# Patient Record
Sex: Male | Born: 1955 | Race: White | Hispanic: No | Smoking: Current every day smoker
Health system: Southern US, Community
[De-identification: ages and names within clinical notes are randomized; demographics above are authoritative.]

## PROBLEM LIST (undated history)

## (undated) DIAGNOSIS — I10 Essential (primary) hypertension: Secondary | ICD-10-CM

## (undated) DIAGNOSIS — J449 Chronic obstructive pulmonary disease, unspecified: Secondary | ICD-10-CM

## (undated) DIAGNOSIS — G4730 Sleep apnea, unspecified: Secondary | ICD-10-CM

## (undated) HISTORY — PX: SHOULDER SURGERY: SHX246

---

## 2004-01-10 ENCOUNTER — Emergency Department (HOSPITAL_COMMUNITY): Admission: EM | Admit: 2004-01-10 | Discharge: 2004-01-10 | Payer: Self-pay | Admitting: *Deleted

## 2004-01-20 ENCOUNTER — Emergency Department (HOSPITAL_COMMUNITY): Admission: EM | Admit: 2004-01-20 | Discharge: 2004-01-21 | Payer: Self-pay | Admitting: Emergency Medicine

## 2004-02-14 ENCOUNTER — Emergency Department (HOSPITAL_COMMUNITY): Admission: EM | Admit: 2004-02-14 | Discharge: 2004-02-14 | Payer: Self-pay | Admitting: Emergency Medicine

## 2004-03-01 ENCOUNTER — Ambulatory Visit: Payer: Self-pay | Admitting: Orthopedic Surgery

## 2004-03-14 ENCOUNTER — Ambulatory Visit: Payer: Self-pay | Admitting: Orthopedic Surgery

## 2004-05-28 ENCOUNTER — Ambulatory Visit: Payer: Self-pay | Admitting: Orthopedic Surgery

## 2004-06-20 ENCOUNTER — Ambulatory Visit (HOSPITAL_COMMUNITY): Admission: RE | Admit: 2004-06-20 | Discharge: 2004-06-20 | Payer: Self-pay | Admitting: Orthopedic Surgery

## 2004-07-02 ENCOUNTER — Ambulatory Visit: Payer: Self-pay | Admitting: Orthopedic Surgery

## 2004-07-17 ENCOUNTER — Ambulatory Visit: Payer: Self-pay | Admitting: Orthopedic Surgery

## 2004-07-17 ENCOUNTER — Ambulatory Visit (HOSPITAL_COMMUNITY): Admission: RE | Admit: 2004-07-17 | Discharge: 2004-07-17 | Payer: Self-pay | Admitting: Orthopedic Surgery

## 2004-07-19 ENCOUNTER — Ambulatory Visit: Payer: Self-pay | Admitting: Orthopedic Surgery

## 2004-08-08 ENCOUNTER — Ambulatory Visit: Payer: Self-pay | Admitting: Orthopedic Surgery

## 2004-10-08 ENCOUNTER — Ambulatory Visit: Payer: Self-pay | Admitting: Orthopedic Surgery
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
## 2006-05-16 ENCOUNTER — Emergency Department (HOSPITAL_COMMUNITY): Admission: EM | Admit: 2006-05-16 | Discharge: 2006-05-16 | Payer: Self-pay | Admitting: Emergency Medicine

## 2006-08-09 ENCOUNTER — Inpatient Hospital Stay (HOSPITAL_COMMUNITY): Admission: EM | Admit: 2006-08-09 | Discharge: 2006-08-12 | Payer: Self-pay | Admitting: Emergency Medicine

## 2007-06-04 ENCOUNTER — Emergency Department (HOSPITAL_COMMUNITY): Admission: EM | Admit: 2007-06-04 | Discharge: 2007-06-04 | Payer: Self-pay | Admitting: Emergency Medicine

## 2007-10-07 ENCOUNTER — Ambulatory Visit (HOSPITAL_COMMUNITY): Admission: RE | Admit: 2007-10-07 | Discharge: 2007-10-07 | Payer: Self-pay | Admitting: Family Medicine

## 2010-09-07 NOTE — H&P (Signed)
[name], [name] NO.:  [id number]   MEDICAL RECORD NO.:  [id number]          PATIENT TYPE:  INP   LOCATION:  A206                          FACILITY:  [facility]   PHYSICIAN:  Skeet Latch, DO    DATE OF BIRTH:  12-Dec-1955   DATE OF ADMISSION:  08/08/2006  DATE OF DISCHARGE:  LH                              HISTORY & PHYSICAL   CHIEF COMPLAINT:  1. Abdominal pain.  2. Chest pain.   HISTORY OF PRESENT ILLNESS:  This is a 55 year old Caucasian male who  presents complaining of some abdominal and chest discomfort times a few  days.  The patient states he has been having some left shoulder, hand,  arm pain with some radiation to the left side of his chest for the last  approximately 3 days.  The patient also complaining of some numbness of  his right hand that he has been taking Tylenol for.  The patient  describes the chest pain as episodic, located in his chest, it radiates  sometimes to his bilateral arms and shoulders.  This worse is 10/10.  Patient denied any history of any cardiac disease or previous chest pain  episodes, states that he routinely has chronic back pain which he takes  medication for.  The patient states because of these symptoms he has had  to come to the emergency room.  Upon coming to the emergency room he was  found to have a BUN of 52 and a creatinine of 5.47 and a sodium of 132.  When asked about water intake, patient states that he drinks very little  water and has been out in the sun recently and has gotten sunburn.  Patient also states that [facility] the past and has been off his pain medication for  awhile.  Upon my exam, patient is chest pain free and in no acute  distress.   PAST MEDICAL HISTORY:  1. Hypertension.  2. Chronic back pain.   SURGICAL HISTORY:  Rotator cuff repair left side.   SOCIAL HISTORY:  1. A 1-1/2 pack per day smoker for over 30 years.  2.  Denies any alcohol or illicit drug use.   ALLERGIES:  NO KNOWN DRUG ALLERGIES.   MEDICATIONS:  1. Lisinopril/HCTZ 20/12.5 once a day.  2. Flexeril as needed.  3. Aspirin 81 mg daily.   REVIEW OF SYSTEMS:  CONSTITUTIONAL:  The patient does admit to some  weight gain, unknown amount.  Denies any fever, chills.  HEENT:  Denies  any eye pain, blurry vision, throat pain or neck pain.  CARDIOVASCULAR:  Some slight chest pain.  RESPIRATORY:  Denies any cough, dyspnea,  wheezing.  GASTROINTESTINAL:  Denies any abdominal pain.  Does have some  abdominal distention.  No diarrhea.  GENITOURINARY:  Denies dysuria or  urgency, flank pain.  MUSCULOSKELETAL:  Positive for chronic back pain  and does have some left shoulder and arm pain.  All other systems are  negative.   PHYSICAL EXAM:  VITAL SIGNS:  Temperature 97.5, pulse 106,  respirations  20, blood pressure 98/52.  HEENT:  Head is atraumatic, normocephalic.  PERRLA, EOMI.  NECK:  Nontender, nondistended.  No thyromegaly, no JVD.  CARDIOVASCULAR:  Tachycardia, regular rhythm.  No murmurs, gallops or  rubs.  RESPIRATORY:  Lungs are clear to auscultation bilaterally.  No rales,  rhonchi or wheezing.  ABDOMEN:  Soft, distended, nontender, decreased bowel sounds occurred.  EXTREMITIES:  No clubbing, cyanosis, erythema.  NEUROLOGICALLY:  Cranial nerves II-XII are grossly intact.  The patient  is alert and oriented x3.   LABS:  ABG:  A pH 7.324, pCO2 is 42.8, pO2 104, bicarb 21.6.  Sodium  132, potassium 4.8, chloride 94, CO2 25, glucose 110.  BUN 52,  creatinine 5.47, AST is 36, ALT 61, amylase 118, lipase 26, troponin  less than 0.05 x2, myoglobin over 500, BNP less than 30, alkaline  phosphatase 62.  Chest x-ray showed no acute findings.   ASSESSMENT AND PLAN:  1. Acute renal failure/insufficiency.  Patient will be IV hydrated      aggressively.  Patient will receive another bolus then have a      maintenance IV at 125 mL per hour.  Will  get a repeat BUN and      creatinine in the a.m. as well as a renal consult.  2. Chest pain.  Unsure if this is cardiac in nature versus      musculoskeletal.  Patient's troponins have been negative times two,      I will repeat EKG in the a.m., patient will be sent to Telemetry      Unit for monitoring.  3. Hyponatremia.  Patient will be IV hydrated with normal saline.  Any      recommendations will be performed based on repeat sodium that is      repeated in the morning.  4. Patient was found to be a little hypotensive.  Hopefully this was      corrected with his IV fluids and will get blood cultures times two      as well as urine culture and sensitivity.  Will hold his blood      pressure medications if blood pressure continues to be low.  The      patient will be followed carefully.      Skeet Latch, DO  Electronically Signed     SM/MEDQ  D:  08/09/2006  T:  08/09/2006  Job:  [id number]

## 2010-09-07 NOTE — Group Therapy Note (Signed)
Christian Aguilar, Christian Aguilar NO.:  [id number]   MEDICAL RECORD NO.:  [id number]          PATIENT TYPE:  INP   LOCATION:  A206                          FACILITY:  [facility]   PHYSICIAN:  Margaretmary Dys, M.D.DATE OF BIRTH:  [birth year]   DATE OF PROCEDURE:  08/09/2006  DATE OF DISCHARGE:                                 PROGRESS NOTE   SUBJECTIVE:  The patient feels a little bit better, continues to have a  backache which is fairly chronic. The patient was admitted with  generalized pain and also chest pain.  Frankly, the patient says over  the past three days he has been working with someone in the yard and  also carrying lumber. The patient has some sunburn too.  He has not had  much water to drink.  On admission he was hypotensive and was in acute  renal failure.   OBJECTIVE:  GENERAL:  Conscious, alert and comfortable. No in acute  distress although the patient was complaining about his Foley catheter.  VITAL SIGNS:  His blood pressure was improved to 97/58, pulse of 91,  respiration was 20, temperature was 96.5, pulse of 93, O2 saturation was  94% on 2 liters.  HEENT EXAM:  Normocephalic, atraumatic. Oral mucosa was dry, no exudates  were noted.  NECK:  Supple, no JVD or lymphadenopathy.  LUNGS:  Clear bilaterally with good air entry.  HEART:  S1, S2 regular, no S3, gallops or rubs.  ABDOMEN:  Soft, nontender, bowel sounds positive, no mass is palpable.  EXTREMITIES:  No edema.   DIAGNOSTIC DATA:  White blood cell count was 13,000, hemoglobin of 13,  hematocrit 35.2, platelet count was 291,000. Sodium 133, potassium 5,  chloride of 99, CO2 22, glucose of 98, BUN of 51, creatinine was 5.52.  AST is 36, ALT of 61.  His Myoglobin was greater than 500. TSH was 1.8.  Urine drug screen was positive for benzodiazepines and opiates. These  are the medications the patient has been on.   ASSESSMENT AND PLAN:  A 55 year old Caucasian male admitted yesterday  with generalized  muscle aches and also sunburn. The patient said he has  been doing some strenuous activity over the past 3 days.  On evaluation  and actual review, I think that he may have rhabdomyolysis.  This causes  Myoglobin to be elevated and also caused his acute renal failure.   I will increase his IV fluids at this time to saline at 200 cc/Hr. I  agree with treatment with Kayexalate by the nephrologist.  Will repeat  BUN and creatinine in the morning and also CK and Myoglobin levels.   His blood pressure is slightly improved. Would continue checking I&O's  at this time.  I do not see any evidence of a drug induced or viral  induced rhabdomyolysis.   I appreciate Dr. Susa Griffins input.      Margaretmary Dys, M.D.  Electronically Signed     AM/MEDQ  D:  08/09/2006  T:  08/09/2006  Job:  [id number]   cc:   Jorja Loa, M.D.  Fax: [phone]

## 2010-09-07 NOTE — Discharge Summary (Signed)
NAMEJAHKEEM, KURKA NO.:  [id number]   MEDICAL RECORD NO.:  [id number]          PATIENT TYPE:  INP   LOCATION:  A206                          FACILITY:  [facility]   PHYSICIAN:  Skeet Latch, DO    DATE OF BIRTH:  May 23, 1955   DATE OF ADMISSION:  08/08/2006  DATE OF DISCHARGE:  04/22/2008LH                               DISCHARGE SUMMARY   DISCHARGE DIAGNOSES:  1. Acute renal failure.  2. Hypotension.  3. Hyponatremia.  4. Leukocytosis.  5. Atypical chest pain.   BRIEF HOSPITAL COURSE:  This is a 55 year old Caucasian male who  presented complaining of some mild abdominal pain and chest discomfort  for a few days.  The patient stated he had some left shoulder, hand and  arm pain with radiation to his left chest for approximately 3 days.  At  that time patient did describe his chest pain as episodic, located in  his chest, that radiated to bilateral arms and shoulders.  It was 10/10  at its worse.  The patient had no history of cardiac disease or any  previous episodes of chest pain.  The patient does have a history of  chronic back pain for which he takes medication.  Upon presenting to the  emergency room, the patient was found to have a BUN of 52, creatinine of  5.47 and sodium of 132.  The patient was admitted and IV hydrated  aggressively.  Also, a nephrology consult was obtained.  The patient was  subsequently placed on Lasix and diuresed, and his BUN and creatinine  returned to within normal limits.  The patient had leukocytosis which  also improved.  The patient was hyponatremic; with IV hydration this  also returned to normal range.  The patient's CK and myoglobin levels  were also elevated and, with IV hydration, they also have returned to  normal range.  The patient's Lasix was discontinued and his IV fluids  were decreased.  The patient's symptoms have improved.  The patient has  been chest pain-free during his total hospital stay.  The patient did  have a chest x-ray performed which was negative.  Also had an abdominal  exam and x-ray which was also negative.  Had renal ultrasound performed,  which also was within normal limits.   VITALS ON DISCHARGE:  Blood pressure is 135/95, respirations are 20,  temperature is 97.3 and heart rate is 108.   DISCHARGE MEDICATIONS:  Include aspirin 81 mg daily, Lorcet 10/650 mg  p.o. q. 6h p.r.n. lisinopril/HCTZ 20/12.5 mg p.o. daily, and Flexeril 10  mg t.i.d. p.r.n.   DISCHARGE INSTRUCTIONS:  The patient is to follow-up with his primary  care physician in 7-10 days.  The patient will start on a heart-healthy  diet.  The patient return to work in 1-2 weeks.   ACTIVITY:  Increase activity slowly.   CONDITION ON DISCHARGE:  The patient be discharged to home in stable  condition.      Skeet Latch, DO  Electronically Signed     SM/MEDQ  D:  08/12/2006  T:  08/12/2006  Job:  [id number] 

## 2010-09-07 NOTE — Op Note (Signed)
NAMENOA, CONSTANTE NO.:  [id number]   MEDICAL RECORD NO.:  [id number]          PATIENT TYPE:  AMB   LOCATION:  DAY                           FACILITY:  [facility]   PHYSICIAN:  Vickki Hearing, M.D.DATE OF BIRTH:  [birth year]   DATE OF PROCEDURE:  07/17/2004  DATE OF DISCHARGE:                                 OPERATIVE REPORT   PREOPERATIVE DIAGNOSIS:  Rotator cuff tear, left shoulder; osteoarthritis  acromioclavicular joint, left shoulder.   POSTOPERATIVE DIAGNOSIS:  Osteoarthritis and rotator cuff syndrome, left  shoulder.   PROCEDURE:  Open acromioplasty and Mumford, left shoulder.   SURGEON:  Dr. Romeo Apple.   ANESTHETIC:  General.   OPERATIVE FINDINGS:  Rotator cuff was intact. There was a thickened  subacromial bursa, and there was impinging osteophyte of the  acromioclavicular joint.   INDICATIONS:  Pain. MRI indicating high-grade partial tearing of the rotator  cuff.   DETAILS OF PROCEDURE:  Mr. Broad was identified in preoperative holding  area. His left shoulder was marked for surgery, countersigned by the  surgeon. He was given preoperative antibiotics. His chart was reviewed, his  history and physical was updated, and he was taken to surgery. He had  general anesthesia. He was placed supine with a bump under left shoulder  with a modified beach-chair position. He had his left shoulder prepped and  draped using sterile technique. A time-out was taken. The procedure was  confirmed as a left shoulder open rotator cuff repair and open Mumford  procedure.   An incision was made over the acromioclavicular joint and extended in  between the mid and anterior deltoid. Subcutaneous tissue was divided. The  raphe between the middle and anterior deltoid was split as one continuous  flap and carried across the [facility] joint. Subperiosteal dissection exposed the  [facility] joint, and the distal clavicle was removed. An acromioplasty was  performed. A bursectomy was  done, and the rotator cuff was inspected, found  to be intact. The wounds were irrigated. The soft tissues were injected with  Marcaine. The deltoid fascial trapezial split was closed with interrupted  suture followed by closure of the subcu with 2-0 Monocryl. Skin staples were  used to  close the skin. A pain pump catheter was inserted in the subcu tissue.  Sterile dressings were applied. The patient was extubated, placed in a  CryoCuff and taken recovery room in stable condition. He can start physical  therapy in three days, and he can be discharged home today. Follow-up in 2  days      SEH/MEDQ  D:  07/17/2004  T:  07/17/2004  Job:  [id number]
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
## 2010-09-07 NOTE — Group Therapy Note (Signed)
Christian Aguilar, Christian Aguilar               ACCOUNT NO.:  [id number]   MEDICAL RECORD NO.:  [id number]          PATIENT TYPE:  INP   LOCATION:  A206                          FACILITY:  [facility]   PHYSICIAN:  Margaretmary Dys, M.D.DATE OF BIRTH:  08-Sep-1955   DATE OF PROCEDURE:  DATE OF DISCHARGE:                                 PROGRESS NOTE   SUBJECTIVE:  The patient reports some improvement in his pain. He was  admitted yesterday with acute renal failure and generalized myalgia.  There was also a concern for possible rhabdomyolysis. He denies any  chest pain. His blood pressure is improved, as he was also hypotensive.   OBJECTIVE:  GENERAL:  Conscious, laying comfortable, no acute distress.  VITAL SIGNS:  Blood pressure 127/79, pulse 100, respiration 22,  temperature max 98.6, oxygen saturation 94% on 2 1/2 liters.  HEENT:  Normocephalic atraumatic. Oral mucosa was moist. No exudate.  NECK:  Supple with no JVD or lymphadenopathy.  LUNGS:  Clear clinically with good air entry bilaterally.  HEART:  S1, S2. Regular rate and rhythm with no murmurs, gallops, or  rubs.  ABDOMEN:  Obese, soft. Bowel sounds were positive.  EXTREMITIES:  Bilaterally without any pedal edema.  CNS: Grossly intact.   LABORATORY DATA:  Kidney ultrasound was normal with no evidence of  hydronephrosis.   WBC was 14.2, hemoglobin 11.4, hematocrit 72.2, platelet count 73.   Sodium 129, potassium 4.9, chloride 101, CO2 19, glucose 111, BUN 57,  creatinine 5.99 which is slightly worse compared to yesterday. Calcium  8.2, total CK was 529. Urinalysis shows positive blood, trace protein;  microscopy shows 11-20 wbc's with few bacteria. Input and output shows  reduced output for the last 24 hours despite a significant amount of  fluid.   ASSESSMENT AND PLAN:  Mr. Christian Aguilar is a 55 year old Caucasian male admitted  with acute renal failure.   It appears that the patient was also hypotensive when he was admitted,  possibly  from dehydration. His myoglobin level was also noted to be  elevated. His acute renal failure is secondary to rhabdomyolysis, acute  tubular necrosis. His urine output has dropped off in the past 24 hours.  The patient has also been seen by nephrology, Dr. Kristian Covey.   I agree with the plan to diurese him with Lasix. I will continue to  monitor him. We will continue IV fluids at current rate. The patient  does not have hyperkalemia or indications for emergency dialysis at this  time. He remains hemodynamically stable.      Margaretmary Dys, M.D.  Electronically Signed     AM/MEDQ  D:  08/11/2006  T:  08/11/2006  Job:  [id number]
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
## 2011-02-21 DIAGNOSIS — Y92410 Unspecified street and highway as the place of occurrence of the external cause: Secondary | ICD-10-CM | POA: Insufficient documentation

## 2011-02-21 DIAGNOSIS — J449 Chronic obstructive pulmonary disease, unspecified: Secondary | ICD-10-CM | POA: Insufficient documentation

## 2011-02-21 DIAGNOSIS — J4489 Other specified chronic obstructive pulmonary disease: Secondary | ICD-10-CM | POA: Insufficient documentation

## 2011-02-21 DIAGNOSIS — I10 Essential (primary) hypertension: Secondary | ICD-10-CM | POA: Insufficient documentation

## 2011-02-21 DIAGNOSIS — R079 Chest pain, unspecified: Secondary | ICD-10-CM | POA: Insufficient documentation

## 2011-02-21 DIAGNOSIS — G4730 Sleep apnea, unspecified: Secondary | ICD-10-CM | POA: Insufficient documentation

## 2011-02-21 DIAGNOSIS — E119 Type 2 diabetes mellitus without complications: Secondary | ICD-10-CM | POA: Insufficient documentation

## 2011-02-21 DIAGNOSIS — Z79899 Other long term (current) drug therapy: Secondary | ICD-10-CM | POA: Insufficient documentation

## 2011-02-22 ENCOUNTER — Encounter: Payer: Self-pay | Admitting: *Deleted

## 2011-02-22 ENCOUNTER — Emergency Department (HOSPITAL_COMMUNITY): Payer: No Typology Code available for payment source

## 2011-02-22 ENCOUNTER — Emergency Department (HOSPITAL_COMMUNITY)
Admission: EM | Admit: 2011-02-22 | Discharge: 2011-02-22 | Disposition: A | Discharge status: Home / Self Care | Payer: No Typology Code available for payment source | Attending: Emergency Medicine | Admitting: Emergency Medicine

## 2011-02-22 DIAGNOSIS — T148XXA Other injury of unspecified body region, initial encounter: Secondary | ICD-10-CM

## 2011-02-22 HISTORY — DX: Essential (primary) hypertension: I10

## 2011-02-22 HISTORY — DX: Sleep apnea, unspecified: G47.30

## 2011-02-22 HISTORY — DX: Chronic obstructive pulmonary disease, unspecified: J44.9

## 2011-02-22 MED ORDER — HYDROCODONE-ACETAMINOPHEN 5-500 MG PO TABS: 1.0000 | ORAL_TABLET | Freq: Four times a day (QID) | ORAL | Status: AC | PRN

## 2011-02-22 MED ORDER — CYCLOBENZAPRINE HCL 10 MG PO TABS: 10.0000 mg | ORAL_TABLET | Freq: Two times a day (BID) | ORAL | Status: AC | PRN

## 2011-02-22 MED ORDER — IBUPROFEN 400 MG PO TABS
400.0000 mg | ORAL_TABLET | Freq: Once | ORAL | Status: AC
  Administered 2011-02-22: 400 mg via ORAL
  Filled 2011-02-22: qty 1

## 2011-02-22 NOTE — ED Notes (Signed)
Pt self ambulated out with a steady gait stating no needs 

## 2011-02-22 NOTE — ED Notes (Signed)
Pt self ambulating with a steady gait no hesitation of motion in bending at the waist, full ROM of neck without hesitation or grimace. Pt self repositioning in bed and in room no noted distress.
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
## 2011-03-05 ENCOUNTER — Encounter (HOSPITAL_COMMUNITY): Payer: Self-pay

## 2011-03-05 ENCOUNTER — Emergency Department (HOSPITAL_COMMUNITY)
Admission: EM | Admit: 2011-03-05 | Discharge: 2011-03-05 | Disposition: A | Discharge status: Home / Self Care | Payer: No Typology Code available for payment source | Attending: Emergency Medicine | Admitting: Emergency Medicine

## 2011-03-05 ENCOUNTER — Emergency Department (HOSPITAL_COMMUNITY): Payer: No Typology Code available for payment source

## 2011-03-05 DIAGNOSIS — J449 Chronic obstructive pulmonary disease, unspecified: Secondary | ICD-10-CM | POA: Insufficient documentation

## 2011-03-05 DIAGNOSIS — G4730 Sleep apnea, unspecified: Secondary | ICD-10-CM | POA: Insufficient documentation

## 2011-03-05 DIAGNOSIS — S139XXA Sprain of joints and ligaments of unspecified parts of neck, initial encounter: Secondary | ICD-10-CM | POA: Insufficient documentation

## 2011-03-05 DIAGNOSIS — S39012A Strain of muscle, fascia and tendon of lower back, initial encounter: Secondary | ICD-10-CM

## 2011-03-05 DIAGNOSIS — Y92410 Unspecified street and highway as the place of occurrence of the external cause: Secondary | ICD-10-CM | POA: Insufficient documentation

## 2011-03-05 DIAGNOSIS — S335XXA Sprain of ligaments of lumbar spine, initial encounter: Secondary | ICD-10-CM | POA: Insufficient documentation

## 2011-03-05 DIAGNOSIS — S161XXA Strain of muscle, fascia and tendon at neck level, initial encounter: Secondary | ICD-10-CM

## 2011-03-05 DIAGNOSIS — I10 Essential (primary) hypertension: Secondary | ICD-10-CM | POA: Insufficient documentation

## 2011-03-05 DIAGNOSIS — E119 Type 2 diabetes mellitus without complications: Secondary | ICD-10-CM | POA: Insufficient documentation

## 2011-03-05 DIAGNOSIS — J4489 Other specified chronic obstructive pulmonary disease: Secondary | ICD-10-CM | POA: Insufficient documentation

## 2011-03-05 MED ORDER — HYDROCODONE-ACETAMINOPHEN 5-325 MG PO TABS: 1.0000 | ORAL_TABLET | ORAL | Status: AC | PRN

## 2011-03-05 MED ORDER — HYDROCODONE-ACETAMINOPHEN 5-325 MG PO TABS: 1.0000 | ORAL_TABLET | Freq: Once | ORAL | Status: DC

## 2011-03-05 MED ORDER — CYCLOBENZAPRINE HCL 10 MG PO TABS: 10.0000 mg | ORAL_TABLET | Freq: Once | ORAL | Status: DC

## 2011-03-05 MED ORDER — HYDROCODONE-ACETAMINOPHEN 5-325 MG PO TABS
1.0000 | ORAL_TABLET | Freq: Once | ORAL | Status: AC
  Administered 2011-03-05: 1 via ORAL
  Filled 2011-03-05: qty 1

## 2011-03-05 MED ORDER — CYCLOBENZAPRINE HCL 10 MG PO TABS: 10.0000 mg | ORAL_TABLET | Freq: Three times a day (TID) | ORAL | Status: AC | PRN

## 2011-03-05 MED ORDER — CYCLOBENZAPRINE HCL 10 MG PO TABS
10.0000 mg | ORAL_TABLET | Freq: Once | ORAL | Status: AC
  Administered 2011-03-05: 10 mg via ORAL
  Filled 2011-03-05: qty 1
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
## 2011-03-05 NOTE — ED Notes (Signed)
Pt called for triage. Not in waiting.
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
## 2011-03-05 NOTE — ED Provider Notes (Signed)
Medical screening examination/treatment/procedure(s) were performed by non-physician practitioner and as supervising physician I was immediately available for consultation/collaboration.   Benny Lennert, MD 03/05/11 [phone]
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
## 2012-11-29 ENCOUNTER — Encounter (HOSPITAL_COMMUNITY): Payer: Self-pay | Admitting: Emergency Medicine

## 2012-11-29 ENCOUNTER — Emergency Department (HOSPITAL_COMMUNITY)
Admission: EM | Admit: 2012-11-29 | Discharge: 2012-11-29 | Disposition: A | Discharge status: Home / Self Care | Payer: Medicaid Other | Attending: Emergency Medicine | Admitting: Emergency Medicine

## 2012-11-29 DIAGNOSIS — L03319 Cellulitis of trunk, unspecified: Secondary | ICD-10-CM | POA: Insufficient documentation

## 2012-11-29 DIAGNOSIS — L089 Local infection of the skin and subcutaneous tissue, unspecified: Secondary | ICD-10-CM | POA: Insufficient documentation

## 2012-11-29 DIAGNOSIS — IMO0002 Reserved for concepts with insufficient information to code with codable children: Secondary | ICD-10-CM | POA: Insufficient documentation

## 2012-11-29 DIAGNOSIS — J4489 Other specified chronic obstructive pulmonary disease: Secondary | ICD-10-CM | POA: Insufficient documentation

## 2012-11-29 DIAGNOSIS — Y929 Unspecified place or not applicable: Secondary | ICD-10-CM | POA: Insufficient documentation

## 2012-11-29 DIAGNOSIS — Y939 Activity, unspecified: Secondary | ICD-10-CM | POA: Insufficient documentation

## 2012-11-29 DIAGNOSIS — Z7982 Long term (current) use of aspirin: Secondary | ICD-10-CM | POA: Insufficient documentation

## 2012-11-29 DIAGNOSIS — L02212 Cutaneous abscess of back [any part, except buttock]: Secondary | ICD-10-CM

## 2012-11-29 DIAGNOSIS — F17200 Nicotine dependence, unspecified, uncomplicated: Secondary | ICD-10-CM | POA: Insufficient documentation

## 2012-11-29 DIAGNOSIS — I10 Essential (primary) hypertension: Secondary | ICD-10-CM | POA: Insufficient documentation

## 2012-11-29 DIAGNOSIS — J449 Chronic obstructive pulmonary disease, unspecified: Secondary | ICD-10-CM | POA: Insufficient documentation

## 2012-11-29 DIAGNOSIS — Z8669 Personal history of other diseases of the nervous system and sense organs: Secondary | ICD-10-CM | POA: Insufficient documentation

## 2012-11-29 DIAGNOSIS — T63391A Toxic effect of venom of other spider, accidental (unintentional), initial encounter: Secondary | ICD-10-CM | POA: Insufficient documentation

## 2012-11-29 DIAGNOSIS — L02219 Cutaneous abscess of trunk, unspecified: Secondary | ICD-10-CM | POA: Insufficient documentation

## 2012-11-29 DIAGNOSIS — E119 Type 2 diabetes mellitus without complications: Secondary | ICD-10-CM | POA: Insufficient documentation

## 2012-11-29 DIAGNOSIS — Z79899 Other long term (current) drug therapy: Secondary | ICD-10-CM | POA: Insufficient documentation

## 2012-11-29 MED ORDER — DOXYCYCLINE HYCLATE 100 MG PO CAPS: 100.0000 mg | ORAL_CAPSULE | Freq: Two times a day (BID) | ORAL | Status: AC

## 2012-11-29 MED ORDER — OXYCODONE-ACETAMINOPHEN 5-325 MG PO TABS: 1.0000 | ORAL_TABLET | Freq: Four times a day (QID) | ORAL | Status: AC | PRN

## 2012-11-29 MED ORDER — DOXYCYCLINE HYCLATE 100 MG PO TABS
100.0000 mg | ORAL_TABLET | Freq: Once | ORAL | Status: AC
  Administered 2012-11-29: 100 mg via ORAL
  Filled 2012-11-29: qty 1

## 2012-11-29 NOTE — ED Provider Notes (Addendum)
CSN: [id number]     Arrival date & time 11/29/12  1835 History     First MD Initiated Contact with Patient 11/29/12 1853     Chief Complaint  Patient presents with  . Insect Bite   (Consider location/radiation/quality/duration/timing/severity/associated sxs/prior Treatment) The history is provided by the patient.   57 year old male primary care doctors in [place]. Patient is fairly certain that he was bit by a spider 2 days ago on the left part of his back. Kill the spider in his bed. Now has a reddened swollen area on round the left upper back. Patient describes the pain as 8/10. Denies any neurological symptoms. No chest pain no shortness of breath no headache no fevers. Pain described as sharp.  Past Medical History  Diagnosis Date  . Diabetes mellitus   . Hypertension   . Sleep apnea   . COPD (chronic obstructive pulmonary disease)    Past Surgical History  Procedure Laterality Date  . Shoulder surgery     No family history on file. History  Substance Use Topics  . Smoking status: Current Every Day Smoker  . Smokeless tobacco: Not on file  . Alcohol Use: No    Review of Systems  Constitutional: Negative for fever.  HENT: Negative for facial swelling.   Eyes: Negative for redness.  Respiratory: Negative for shortness of breath.   Cardiovascular: Negative for chest pain.  Gastrointestinal: Negative for nausea, vomiting and abdominal pain.  Genitourinary: Negative for dysuria and hematuria.  Musculoskeletal: Positive for back pain.  Skin: Negative for rash.  Neurological: Negative for headaches.  Hematological: Does not bruise/bleed easily.  Psychiatric/Behavioral: Negative for confusion.    Allergies  Bee venom and Fentanyl  Home Medications   Current Outpatient Rx  Name  Route  Sig  Dispense  Refill  . albuterol (PROVENTIL HFA;VENTOLIN HFA) 108 (90 BASE) MCG/ACT inhaler   Inhalation   Inhale 2 puffs into the lungs every 6 (six) hours as needed. For shortness  of breath         . aspirin 81 MG tablet   Oral   Take 81 mg by mouth at bedtime.          . cholecalciferol (VITAMIN D) 1000 UNITS tablet   Oral   Take 2,000 Units by mouth every morning.          . diazepam (VALIUM) 5 MG tablet   Oral   Take 5 mg by mouth every 6 (six) hours as needed. For anxiety         . doxycycline (VIBRAMYCIN) 100 MG capsule   Oral   Take 1 capsule (100 mg total) by mouth 2 (two) times daily.   14 capsule   0   . fenofibrate (TRICOR) 145 MG tablet   Oral   Take 145 mg by mouth every morning.          . fish oil-omega-3 fatty acids 1000 MG capsule   Oral   Take 1-2 g by mouth daily. Take two capsules every morning and take one capsule at bedtime         . Fluticasone-Salmeterol (ADVAIR) 250-50 MCG/DOSE AEPB   Inhalation   Inhale 1 puff into the lungs every 12 (twelve) hours.           . gabapentin (NEURONTIN) 100 MG capsule   Oral   Take 100 mg by mouth 3 (three) times daily.           Marland Kitchen lisinopril-hydrochlorothiazide (PRINZIDE,ZESTORETIC) 20-12.5 MG per  tablet   Oral   Take 1 tablet by mouth daily.           . metFORMIN (GLUCOPHAGE) 1000 MG tablet   Oral   Take 1,000 mg by mouth 2 (two) times daily with a meal.           . metoprolol (LOPRESSOR) 50 MG tablet   Oral   Take 50 mg by mouth 2 (two) times daily.           Marland Kitchen oxyCODONE-acetaminophen (PERCOCET) 10-325 MG per tablet   Oral   Take 1 tablet by mouth every 4 (four) hours as needed.           Marland Kitchen oxyCODONE-acetaminophen (PERCOCET/ROXICET) 5-325 MG per tablet   Oral   Take 1-2 tablets by mouth every 6 (six) hours as needed for pain.   20 tablet   0   . simvastatin (ZOCOR) 40 MG tablet   Oral   Take 40 mg by mouth at bedtime.            BP 102/59  Pulse 80  Temp(Src) 97.2 F (36.2 C) (Oral)  Resp 18  Ht 5\' 6"  (1.676 m)  Wt 204 lb (92.534 kg)  BMI 32.94 kg/m2  SpO2 97% Physical Exam  Nursing note and vitals reviewed. Constitutional: He is  oriented to person, place, and time. He appears well-developed and well-nourished. No distress.  HENT:  Head: Normocephalic and atraumatic.  Eyes: Conjunctivae and EOM are normal. Pupils are equal, round, and reactive to light.  Neck: Normal range of motion. Neck supple.  Cardiovascular: Normal rate, regular rhythm and normal heart sounds.   No murmur heard. Pulmonary/Chest: Effort normal and breath sounds normal.  Abdominal: Soft. There is no tenderness.  Musculoskeletal: Normal range of motion. He exhibits tenderness.  Patient's left upper back with an area of erythema that is about 5 cm in size. No fluctuance. 2 papule areas without pustules one measuring about 5 mm the other about 7 mm.  Lymphadenopathy:    He has no cervical adenopathy.  Neurological: He is alert and oriented to person, place, and time. No cranial nerve deficit. He exhibits normal muscle tone. Coordination normal.  Skin: Skin is warm. There is erythema.    ED Course   Procedures (including critical care time)  Labs Reviewed - No data to display No results found. 1. Abscess of back     MDM  Patient with a 4 cm area of erythema 2 areas of induration nonpustular no fluctuance developing abscess left part of the back. Could be related to insect bite or could just be a skin abscess. Not ready for I&D at this point in time. Treat with antibiotics. Patient given precautions.  Shelda Jakes, MD 11/29/12 Christian Aguilar  Shelda Jakes, MD 11/29/12 [phone]

## 2012-11-29 NOTE — ED Notes (Signed)
States that he thinks that he was bitten by an insect yesterday.  The area appears red and swollen with a purulent center.

## 2014-12-29 ENCOUNTER — Other Ambulatory Visit (HOSPITAL_COMMUNITY): Payer: Self-pay | Admitting: Internal Medicine

## 2014-12-29 DIAGNOSIS — R05 Cough: Secondary | ICD-10-CM

## 2014-12-29 DIAGNOSIS — R053 Chronic cough: Secondary | ICD-10-CM

## 2015-01-04 ENCOUNTER — Ambulatory Visit (HOSPITAL_COMMUNITY): Admission: RE | Admit: 2015-01-04 | Payer: Medicaid Other | Source: Ambulatory Visit

## 2015-01-11 ENCOUNTER — Ambulatory Visit (HOSPITAL_COMMUNITY)
Admission: RE | Admit: 2015-01-11 | Discharge: 2015-01-11 | Disposition: A | Discharge status: Home / Self Care | Payer: Medicare Other | Source: Ambulatory Visit | Attending: Internal Medicine | Admitting: Internal Medicine

## 2015-01-11 DIAGNOSIS — J439 Emphysema, unspecified: Secondary | ICD-10-CM | POA: Insufficient documentation

## 2015-01-11 DIAGNOSIS — I2510 Atherosclerotic heart disease of native coronary artery without angina pectoris: Secondary | ICD-10-CM | POA: Diagnosis not present

## 2015-01-11 DIAGNOSIS — R05 Cough: Secondary | ICD-10-CM | POA: Insufficient documentation

## 2015-01-11 DIAGNOSIS — R053 Chronic cough: Secondary | ICD-10-CM

## 2015-01-11 DIAGNOSIS — F17210 Nicotine dependence, cigarettes, uncomplicated: Secondary | ICD-10-CM | POA: Diagnosis not present

## 2015-01-11 DIAGNOSIS — J410 Simple chronic bronchitis: Secondary | ICD-10-CM | POA: Diagnosis not present

## 2015-04-23 DIAGNOSIS — G4733 Obstructive sleep apnea (adult) (pediatric): Secondary | ICD-10-CM | POA: Diagnosis not present

## 2015-04-23 DIAGNOSIS — J449 Chronic obstructive pulmonary disease, unspecified: Secondary | ICD-10-CM | POA: Diagnosis not present

## 2015-04-28 DIAGNOSIS — M545 Low back pain: Secondary | ICD-10-CM | POA: Diagnosis not present

## 2015-04-28 DIAGNOSIS — I10 Essential (primary) hypertension: Secondary | ICD-10-CM | POA: Diagnosis not present

## 2015-04-28 DIAGNOSIS — E119 Type 2 diabetes mellitus without complications: Secondary | ICD-10-CM | POA: Diagnosis not present

## 2015-05-24 DIAGNOSIS — G4733 Obstructive sleep apnea (adult) (pediatric): Secondary | ICD-10-CM | POA: Diagnosis not present

## 2015-05-24 DIAGNOSIS — J449 Chronic obstructive pulmonary disease, unspecified: Secondary | ICD-10-CM | POA: Diagnosis not present

## 2015-06-21 DIAGNOSIS — G4733 Obstructive sleep apnea (adult) (pediatric): Secondary | ICD-10-CM | POA: Diagnosis not present

## 2015-06-21 DIAGNOSIS — J449 Chronic obstructive pulmonary disease, unspecified: Secondary | ICD-10-CM | POA: Diagnosis not present

## 2015-06-26 DIAGNOSIS — M545 Low back pain: Secondary | ICD-10-CM | POA: Diagnosis not present

## 2015-06-26 DIAGNOSIS — E1142 Type 2 diabetes mellitus with diabetic polyneuropathy: Secondary | ICD-10-CM | POA: Diagnosis not present

## 2015-06-26 DIAGNOSIS — I10 Essential (primary) hypertension: Secondary | ICD-10-CM | POA: Diagnosis not present

## 2015-07-22 DIAGNOSIS — J449 Chronic obstructive pulmonary disease, unspecified: Secondary | ICD-10-CM | POA: Diagnosis not present

## 2015-07-22 DIAGNOSIS — G4733 Obstructive sleep apnea (adult) (pediatric): Secondary | ICD-10-CM | POA: Diagnosis not present

## 2015-08-21 DIAGNOSIS — G4733 Obstructive sleep apnea (adult) (pediatric): Secondary | ICD-10-CM | POA: Diagnosis not present

## 2015-08-21 DIAGNOSIS — J449 Chronic obstructive pulmonary disease, unspecified: Secondary | ICD-10-CM | POA: Diagnosis not present

## 2015-08-25 DIAGNOSIS — I10 Essential (primary) hypertension: Secondary | ICD-10-CM | POA: Diagnosis not present

## 2015-08-25 DIAGNOSIS — M545 Low back pain: Secondary | ICD-10-CM | POA: Diagnosis not present

## 2015-08-25 DIAGNOSIS — E1142 Type 2 diabetes mellitus with diabetic polyneuropathy: Secondary | ICD-10-CM | POA: Diagnosis not present

## 2015-08-25 DIAGNOSIS — Z0000 Encounter for general adult medical examination without abnormal findings: Secondary | ICD-10-CM | POA: Diagnosis not present

## 2015-09-21 DIAGNOSIS — J449 Chronic obstructive pulmonary disease, unspecified: Secondary | ICD-10-CM | POA: Diagnosis not present

## 2015-09-21 DIAGNOSIS — G4733 Obstructive sleep apnea (adult) (pediatric): Secondary | ICD-10-CM | POA: Diagnosis not present

## 2015-10-21 DIAGNOSIS — G4733 Obstructive sleep apnea (adult) (pediatric): Secondary | ICD-10-CM | POA: Diagnosis not present

## 2015-10-21 DIAGNOSIS — J449 Chronic obstructive pulmonary disease, unspecified: Secondary | ICD-10-CM | POA: Diagnosis not present

## 2015-10-23 DIAGNOSIS — E1142 Type 2 diabetes mellitus with diabetic polyneuropathy: Secondary | ICD-10-CM | POA: Diagnosis not present

## 2015-10-23 DIAGNOSIS — I10 Essential (primary) hypertension: Secondary | ICD-10-CM | POA: Diagnosis not present

## 2015-10-23 DIAGNOSIS — M545 Low back pain: Secondary | ICD-10-CM | POA: Diagnosis not present

## 2015-11-21 DIAGNOSIS — J449 Chronic obstructive pulmonary disease, unspecified: Secondary | ICD-10-CM | POA: Diagnosis not present

## 2015-11-21 DIAGNOSIS — G4733 Obstructive sleep apnea (adult) (pediatric): Secondary | ICD-10-CM | POA: Diagnosis not present

## 2015-12-22 DIAGNOSIS — G4733 Obstructive sleep apnea (adult) (pediatric): Secondary | ICD-10-CM | POA: Diagnosis not present

## 2015-12-22 DIAGNOSIS — L03211 Cellulitis of face: Secondary | ICD-10-CM | POA: Diagnosis not present

## 2015-12-22 DIAGNOSIS — J449 Chronic obstructive pulmonary disease, unspecified: Secondary | ICD-10-CM | POA: Diagnosis not present

## 2015-12-22 DIAGNOSIS — I10 Essential (primary) hypertension: Secondary | ICD-10-CM | POA: Diagnosis not present

## 2015-12-22 DIAGNOSIS — E1142 Type 2 diabetes mellitus with diabetic polyneuropathy: Secondary | ICD-10-CM | POA: Diagnosis not present

## 2015-12-22 DIAGNOSIS — M545 Low back pain: Secondary | ICD-10-CM | POA: Diagnosis not present

## 2016-01-21 DIAGNOSIS — G4733 Obstructive sleep apnea (adult) (pediatric): Secondary | ICD-10-CM | POA: Diagnosis not present

## 2016-01-21 DIAGNOSIS — J449 Chronic obstructive pulmonary disease, unspecified: Secondary | ICD-10-CM | POA: Diagnosis not present

## 2016-01-24 DIAGNOSIS — I10 Essential (primary) hypertension: Secondary | ICD-10-CM | POA: Diagnosis not present

## 2016-01-24 DIAGNOSIS — Z79891 Long term (current) use of opiate analgesic: Secondary | ICD-10-CM | POA: Diagnosis not present

## 2016-01-24 DIAGNOSIS — E1142 Type 2 diabetes mellitus with diabetic polyneuropathy: Secondary | ICD-10-CM | POA: Diagnosis not present

## 2016-02-20 DIAGNOSIS — M545 Low back pain: Secondary | ICD-10-CM | POA: Diagnosis not present

## 2016-02-20 DIAGNOSIS — I10 Essential (primary) hypertension: Secondary | ICD-10-CM | POA: Diagnosis not present

## 2016-02-20 DIAGNOSIS — Z0000 Encounter for general adult medical examination without abnormal findings: Secondary | ICD-10-CM | POA: Diagnosis not present

## 2016-02-20 DIAGNOSIS — E1142 Type 2 diabetes mellitus with diabetic polyneuropathy: Secondary | ICD-10-CM | POA: Diagnosis not present

## 2016-02-20 DIAGNOSIS — Z1389 Encounter for screening for other disorder: Secondary | ICD-10-CM | POA: Diagnosis not present

## 2016-02-21 DIAGNOSIS — G4733 Obstructive sleep apnea (adult) (pediatric): Secondary | ICD-10-CM | POA: Diagnosis not present

## 2016-02-21 DIAGNOSIS — J449 Chronic obstructive pulmonary disease, unspecified: Secondary | ICD-10-CM | POA: Diagnosis not present

## 2016-03-22 DIAGNOSIS — G4733 Obstructive sleep apnea (adult) (pediatric): Secondary | ICD-10-CM | POA: Diagnosis not present

## 2016-03-22 DIAGNOSIS — J449 Chronic obstructive pulmonary disease, unspecified: Secondary | ICD-10-CM | POA: Diagnosis not present

## 2016-03-29 DIAGNOSIS — M545 Low back pain: Secondary | ICD-10-CM | POA: Diagnosis not present

## 2016-03-29 DIAGNOSIS — E1142 Type 2 diabetes mellitus with diabetic polyneuropathy: Secondary | ICD-10-CM | POA: Diagnosis not present

## 2016-03-29 DIAGNOSIS — I10 Essential (primary) hypertension: Secondary | ICD-10-CM | POA: Diagnosis not present

## 2016-04-16 DIAGNOSIS — M545 Low back pain: Secondary | ICD-10-CM | POA: Diagnosis not present

## 2016-04-16 DIAGNOSIS — I10 Essential (primary) hypertension: Secondary | ICD-10-CM | POA: Diagnosis not present

## 2016-04-16 DIAGNOSIS — E1142 Type 2 diabetes mellitus with diabetic polyneuropathy: Secondary | ICD-10-CM | POA: Diagnosis not present

## 2016-04-22 DIAGNOSIS — J449 Chronic obstructive pulmonary disease, unspecified: Secondary | ICD-10-CM | POA: Diagnosis not present

## 2016-04-22 DIAGNOSIS — G4733 Obstructive sleep apnea (adult) (pediatric): Secondary | ICD-10-CM | POA: Diagnosis not present

## 2016-05-03 DIAGNOSIS — I10 Essential (primary) hypertension: Secondary | ICD-10-CM | POA: Diagnosis not present

## 2016-05-03 DIAGNOSIS — E1142 Type 2 diabetes mellitus with diabetic polyneuropathy: Secondary | ICD-10-CM | POA: Diagnosis not present

## 2016-05-03 DIAGNOSIS — M545 Low back pain: Secondary | ICD-10-CM | POA: Diagnosis not present

## 2016-05-23 DIAGNOSIS — J449 Chronic obstructive pulmonary disease, unspecified: Secondary | ICD-10-CM | POA: Diagnosis not present

## 2016-05-23 DIAGNOSIS — G4733 Obstructive sleep apnea (adult) (pediatric): Secondary | ICD-10-CM | POA: Diagnosis not present

## 2016-06-04 DIAGNOSIS — M545 Low back pain: Secondary | ICD-10-CM | POA: Diagnosis not present

## 2016-06-04 DIAGNOSIS — E1142 Type 2 diabetes mellitus with diabetic polyneuropathy: Secondary | ICD-10-CM | POA: Diagnosis not present

## 2016-06-04 DIAGNOSIS — I10 Essential (primary) hypertension: Secondary | ICD-10-CM | POA: Diagnosis not present

## 2016-06-20 DIAGNOSIS — J449 Chronic obstructive pulmonary disease, unspecified: Secondary | ICD-10-CM | POA: Diagnosis not present

## 2016-06-20 DIAGNOSIS — G4733 Obstructive sleep apnea (adult) (pediatric): Secondary | ICD-10-CM | POA: Diagnosis not present

## 2016-06-25 DIAGNOSIS — M545 Low back pain: Secondary | ICD-10-CM | POA: Diagnosis not present

## 2016-06-25 DIAGNOSIS — E1142 Type 2 diabetes mellitus with diabetic polyneuropathy: Secondary | ICD-10-CM | POA: Diagnosis not present

## 2016-06-25 DIAGNOSIS — I10 Essential (primary) hypertension: Secondary | ICD-10-CM | POA: Diagnosis not present

## 2016-07-21 DIAGNOSIS — J449 Chronic obstructive pulmonary disease, unspecified: Secondary | ICD-10-CM | POA: Diagnosis not present

## 2016-07-21 DIAGNOSIS — G4733 Obstructive sleep apnea (adult) (pediatric): Secondary | ICD-10-CM | POA: Diagnosis not present

## 2016-07-22 DIAGNOSIS — Z0000 Encounter for general adult medical examination without abnormal findings: Secondary | ICD-10-CM | POA: Diagnosis not present

## 2016-07-22 DIAGNOSIS — I10 Essential (primary) hypertension: Secondary | ICD-10-CM | POA: Diagnosis not present

## 2016-07-22 DIAGNOSIS — M545 Low back pain: Secondary | ICD-10-CM | POA: Diagnosis not present

## 2016-07-22 DIAGNOSIS — E1142 Type 2 diabetes mellitus with diabetic polyneuropathy: Secondary | ICD-10-CM | POA: Diagnosis not present

## 2016-07-29 DIAGNOSIS — I10 Essential (primary) hypertension: Secondary | ICD-10-CM | POA: Diagnosis not present

## 2016-07-29 DIAGNOSIS — M545 Low back pain: Secondary | ICD-10-CM | POA: Diagnosis not present

## 2016-07-29 DIAGNOSIS — E1142 Type 2 diabetes mellitus with diabetic polyneuropathy: Secondary | ICD-10-CM | POA: Diagnosis not present

## 2016-08-20 DIAGNOSIS — J449 Chronic obstructive pulmonary disease, unspecified: Secondary | ICD-10-CM | POA: Diagnosis not present

## 2016-08-20 DIAGNOSIS — G4733 Obstructive sleep apnea (adult) (pediatric): Secondary | ICD-10-CM | POA: Diagnosis not present

## 2016-08-30 DIAGNOSIS — I10 Essential (primary) hypertension: Secondary | ICD-10-CM | POA: Diagnosis not present

## 2016-08-30 DIAGNOSIS — E1142 Type 2 diabetes mellitus with diabetic polyneuropathy: Secondary | ICD-10-CM | POA: Diagnosis not present

## 2016-08-30 DIAGNOSIS — M545 Low back pain: Secondary | ICD-10-CM | POA: Diagnosis not present

## 2016-09-20 DIAGNOSIS — G4733 Obstructive sleep apnea (adult) (pediatric): Secondary | ICD-10-CM | POA: Diagnosis not present

## 2016-09-20 DIAGNOSIS — J449 Chronic obstructive pulmonary disease, unspecified: Secondary | ICD-10-CM | POA: Diagnosis not present

## 2016-09-23 DIAGNOSIS — M545 Low back pain: Secondary | ICD-10-CM | POA: Diagnosis not present

## 2016-09-23 DIAGNOSIS — I10 Essential (primary) hypertension: Secondary | ICD-10-CM | POA: Diagnosis not present

## 2016-09-23 DIAGNOSIS — E1142 Type 2 diabetes mellitus with diabetic polyneuropathy: Secondary | ICD-10-CM | POA: Diagnosis not present

## 2016-10-20 DIAGNOSIS — J449 Chronic obstructive pulmonary disease, unspecified: Secondary | ICD-10-CM | POA: Diagnosis not present

## 2016-10-20 DIAGNOSIS — G4733 Obstructive sleep apnea (adult) (pediatric): Secondary | ICD-10-CM | POA: Diagnosis not present

## 2016-11-01 DIAGNOSIS — M545 Low back pain: Secondary | ICD-10-CM | POA: Diagnosis not present

## 2016-11-01 DIAGNOSIS — E1142 Type 2 diabetes mellitus with diabetic polyneuropathy: Secondary | ICD-10-CM | POA: Diagnosis not present

## 2016-11-01 DIAGNOSIS — I10 Essential (primary) hypertension: Secondary | ICD-10-CM | POA: Diagnosis not present

## 2016-11-20 DIAGNOSIS — G4733 Obstructive sleep apnea (adult) (pediatric): Secondary | ICD-10-CM | POA: Diagnosis not present

## 2016-11-20 DIAGNOSIS — J449 Chronic obstructive pulmonary disease, unspecified: Secondary | ICD-10-CM | POA: Diagnosis not present

## 2016-11-25 DIAGNOSIS — I10 Essential (primary) hypertension: Secondary | ICD-10-CM | POA: Diagnosis not present

## 2016-11-25 DIAGNOSIS — J4520 Mild intermittent asthma, uncomplicated: Secondary | ICD-10-CM | POA: Diagnosis not present

## 2016-11-25 DIAGNOSIS — E1142 Type 2 diabetes mellitus with diabetic polyneuropathy: Secondary | ICD-10-CM | POA: Diagnosis not present

## 2016-11-25 DIAGNOSIS — M545 Low back pain: Secondary | ICD-10-CM | POA: Diagnosis not present

## 2016-12-21 DIAGNOSIS — G4733 Obstructive sleep apnea (adult) (pediatric): Secondary | ICD-10-CM | POA: Diagnosis not present

## 2016-12-21 DIAGNOSIS — J449 Chronic obstructive pulmonary disease, unspecified: Secondary | ICD-10-CM | POA: Diagnosis not present

## 2017-01-18 IMAGING — CT CT CHEST LUNG CANCER SCREENING LOW DOSE W/O CM
2 of 5 series · 14 of 40 positions shown, 17 images · non-contrast
Comparison: No priors.

CLINICAL DATA: 59-year-old male current smoker with 40 pack-year
history of smoking. Lung cancer screening examination.

EXAM:
CT CHEST WITHOUT CONTRAST
TECHNIQUE: Multidetector CT imaging of the chest was performed following the
standard protocol without IV contrast.

[Series 3: lungs · axial · 0.72mm/px · z∈[-292,-12]mm · 11 of 309 slices shown, 14 images]
[im 15/309  mediastinal]
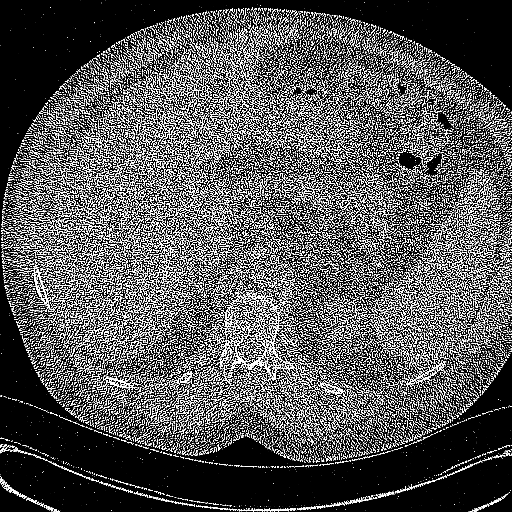
[im 15/309  lung]
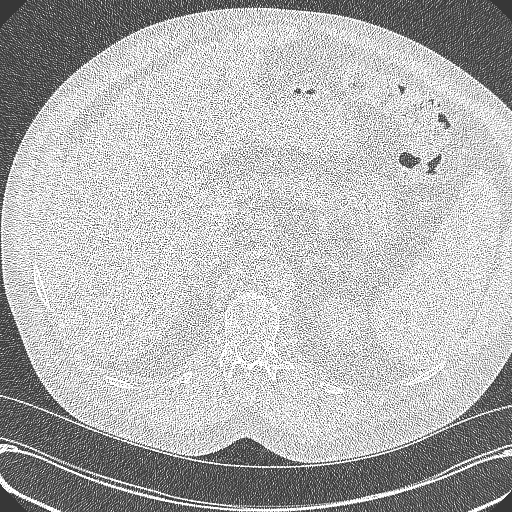
[im 43/309  lung]
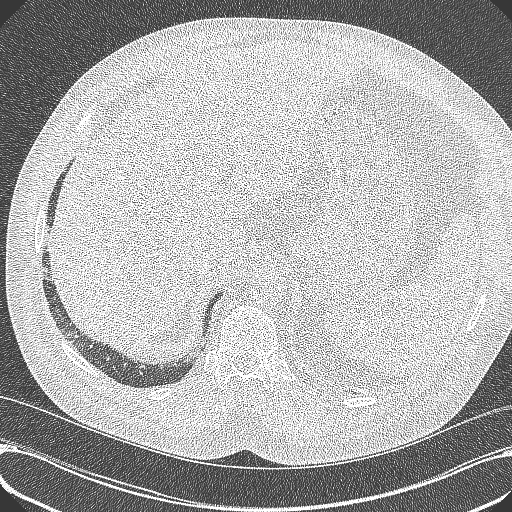
[im 71/309  lung]
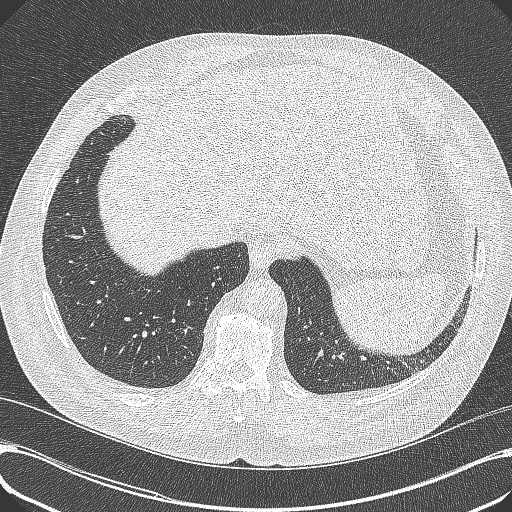
[im 99/309  lung]
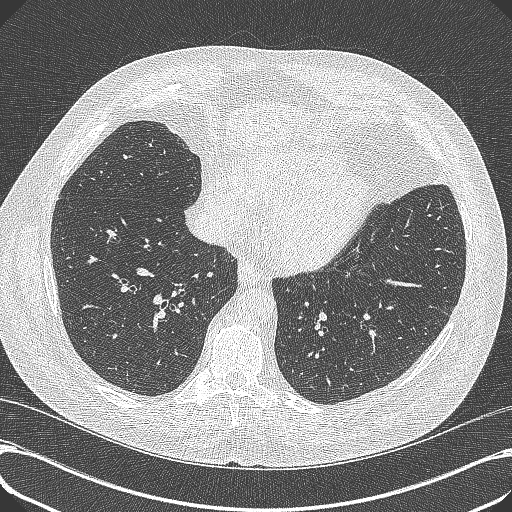
[im 127/309  mediastinal]
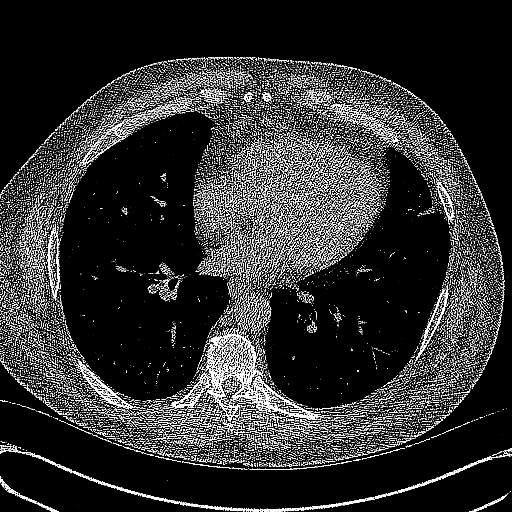
[im 127/309  lung]
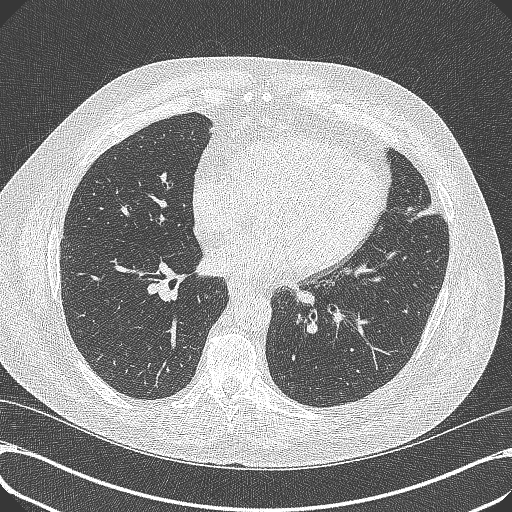
[im 155/309  lung]
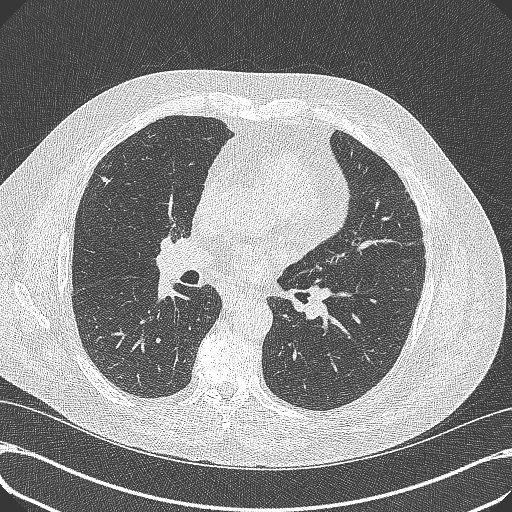
[im 183/309  lung]
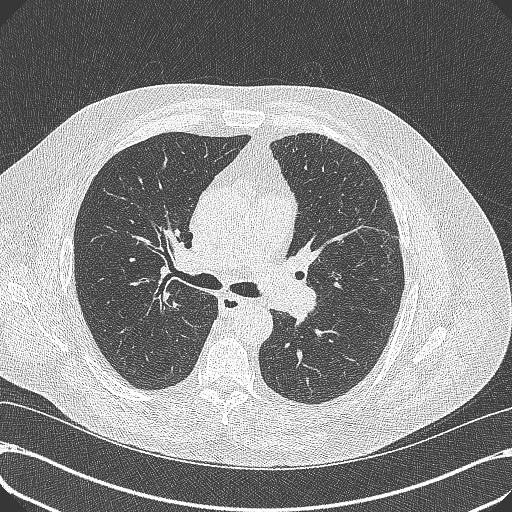
[im 211/309  lung]
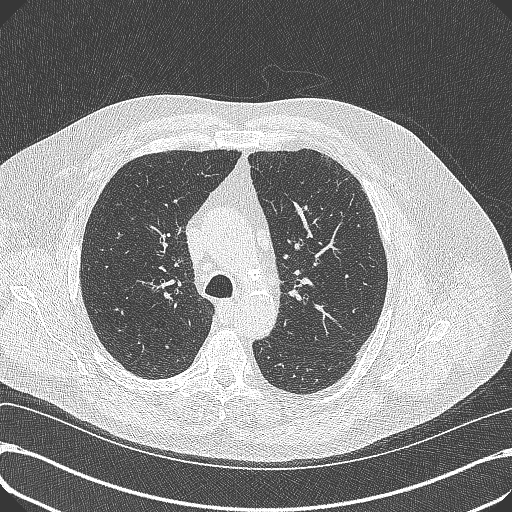
[im 239/309  mediastinal]
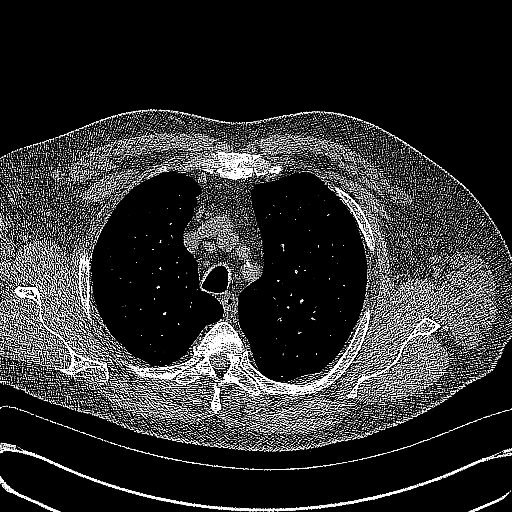
[im 239/309  lung]
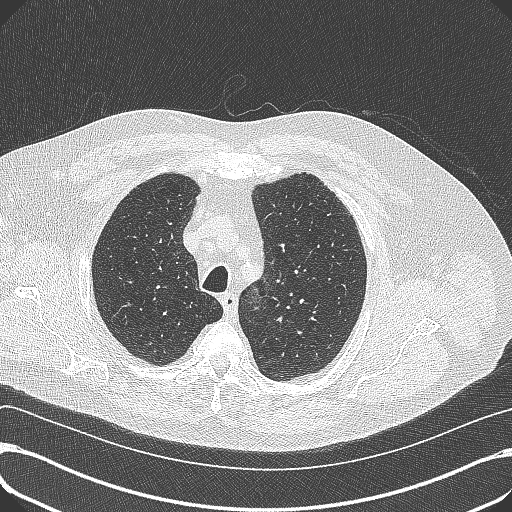
[im 267/309  lung]
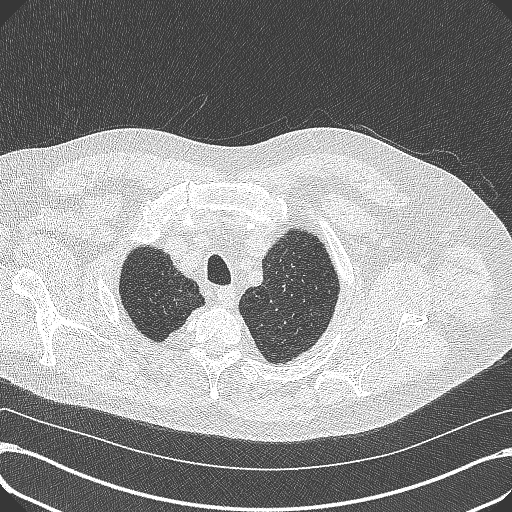
[im 295/309  lung]
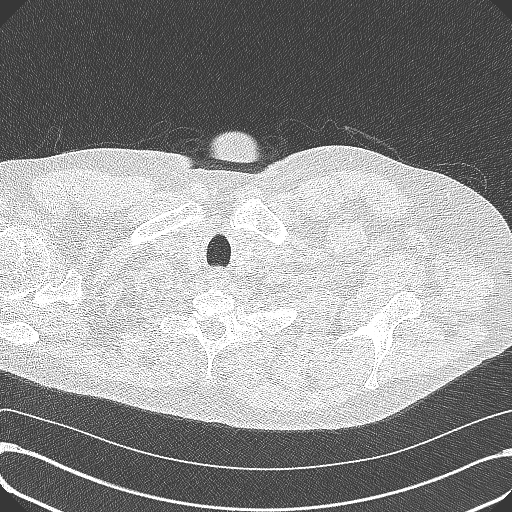

[Series 4: mpr coro 1mm · coronal · 0.60mm/px · 3 of 283 slices shown]
[im 57/283  lung]
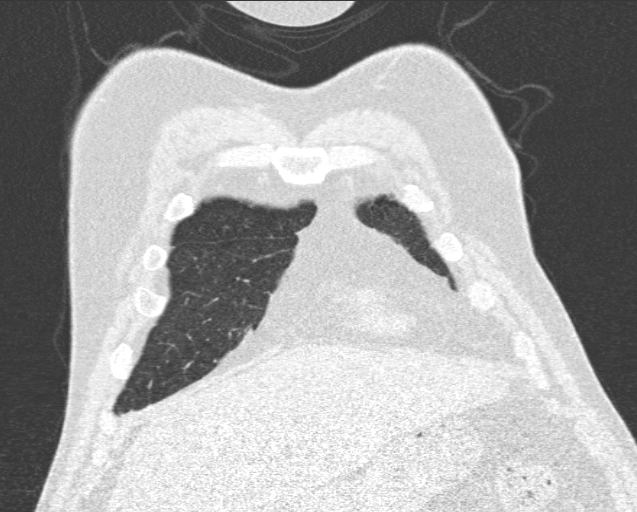
[im 113/283  lung]
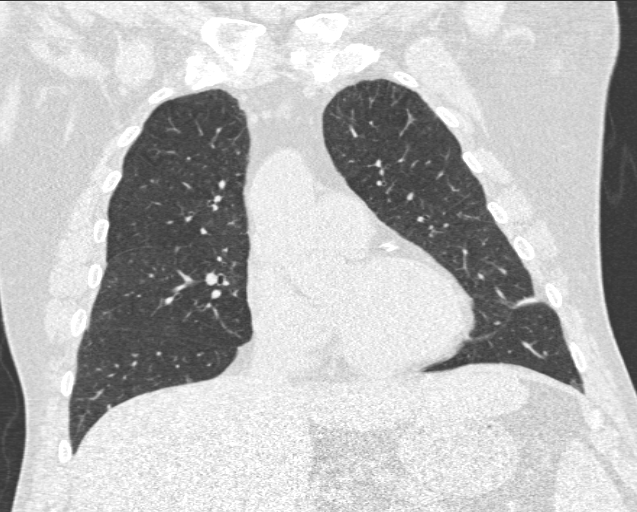
[im 170/283  lung]
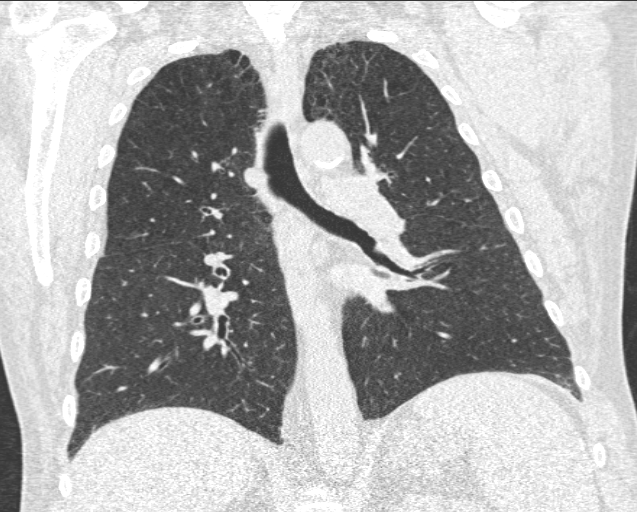

[14 of 40 positions shown; findings below may reference images not displayed]

FINDINGS: Mediastinum/Lymph Nodes: Heart size is normal. There is no
significant pericardial fluid, thickening or pericardial
calcification. There is atherosclerosis of the thoracic aorta, the
great vessels of the mediastinum and the coronary arteries,
including calcified atherosclerotic plaque in the left main, left
anterior descending, left circumflex and right coronary arteries. No
pathologically enlarged mediastinal or hilar lymph nodes. Please
note that accurate exclusion of hilar adenopathy is limited on
noncontrast CT scans. Esophagus is unremarkable in appearance. No
axillary lymphadenopathy.

Lungs/Pleura: Multiple small pulmonary nodules are noted in the
right lung, the largest of which is in the right middle lobe (image
154 of series 3), which has a volume derived mean diameter of
mm. No larger more suspicious appearing pulmonary nodules or masses
are otherwise noted. No acute consolidative airspace disease. No
pleural effusions. Scarring in the inferior segment of the lingula.
Mild diffuse bronchial thickening with mild centrilobular and
paraseptal emphysema.

Upper Abdomen: Slightly nodular contour of the liver, suggestive of
potential cirrhosis.

Musculoskeletal/Soft Tissues: There are no aggressive appearing
lytic or blastic lesions noted in the visualized portions of the
skeleton.
IMPRESSION: 1. Lung-RADS Category 2S, benign appearance or behavior. Continue
annual screening with low-dose chest CT without contrast in 12
months.
2. The "S" modifier above refers to potentially clinically
significant non lung cancer related findings. Specifically, there is
extensive atherosclerosis, including left main and 3 vessel coronary
artery disease. Please note that although the presence of coronary
artery calcium documents the presence of coronary artery disease,
the severity of this disease and any potential stenosis cannot be
assessed on this non-gated CT examination. Assessment for potential
risk factor modification, dietary therapy or pharmacologic therapy
may be warranted, if clinically indicated.
3. Mild diffuse bronchial wall thickening with mild centrilobular
and paraseptal emphysema; imaging findings suggestive of underlying
COPD.
4. Morphologic changes in the liver suggestive of potential early
cirrhosis.

## 2017-01-20 DIAGNOSIS — G4733 Obstructive sleep apnea (adult) (pediatric): Secondary | ICD-10-CM | POA: Diagnosis not present

## 2017-01-20 DIAGNOSIS — J449 Chronic obstructive pulmonary disease, unspecified: Secondary | ICD-10-CM | POA: Diagnosis not present

## 2017-01-24 DIAGNOSIS — Z0000 Encounter for general adult medical examination without abnormal findings: Secondary | ICD-10-CM | POA: Diagnosis not present

## 2017-01-24 DIAGNOSIS — Z1389 Encounter for screening for other disorder: Secondary | ICD-10-CM | POA: Diagnosis not present

## 2017-01-24 DIAGNOSIS — J4520 Mild intermittent asthma, uncomplicated: Secondary | ICD-10-CM | POA: Diagnosis not present

## 2017-01-24 DIAGNOSIS — I10 Essential (primary) hypertension: Secondary | ICD-10-CM | POA: Diagnosis not present

## 2017-01-24 DIAGNOSIS — E1142 Type 2 diabetes mellitus with diabetic polyneuropathy: Secondary | ICD-10-CM | POA: Diagnosis not present

## 2017-01-24 DIAGNOSIS — M545 Low back pain: Secondary | ICD-10-CM | POA: Diagnosis not present

## 2017-02-17 DIAGNOSIS — J4520 Mild intermittent asthma, uncomplicated: Secondary | ICD-10-CM | POA: Diagnosis not present

## 2017-02-17 DIAGNOSIS — I10 Essential (primary) hypertension: Secondary | ICD-10-CM | POA: Diagnosis not present

## 2017-02-17 DIAGNOSIS — M545 Low back pain: Secondary | ICD-10-CM | POA: Diagnosis not present

## 2017-02-17 DIAGNOSIS — E1142 Type 2 diabetes mellitus with diabetic polyneuropathy: Secondary | ICD-10-CM | POA: Diagnosis not present

## 2017-02-20 DIAGNOSIS — G4733 Obstructive sleep apnea (adult) (pediatric): Secondary | ICD-10-CM | POA: Diagnosis not present

## 2017-02-20 DIAGNOSIS — J449 Chronic obstructive pulmonary disease, unspecified: Secondary | ICD-10-CM | POA: Diagnosis not present

## 2017-03-22 DIAGNOSIS — G4733 Obstructive sleep apnea (adult) (pediatric): Secondary | ICD-10-CM | POA: Diagnosis not present

## 2017-03-22 DIAGNOSIS — J449 Chronic obstructive pulmonary disease, unspecified: Secondary | ICD-10-CM | POA: Diagnosis not present

## 2017-03-25 DIAGNOSIS — I10 Essential (primary) hypertension: Secondary | ICD-10-CM | POA: Diagnosis not present

## 2017-03-25 DIAGNOSIS — E1142 Type 2 diabetes mellitus with diabetic polyneuropathy: Secondary | ICD-10-CM | POA: Diagnosis not present

## 2017-03-25 DIAGNOSIS — M545 Low back pain: Secondary | ICD-10-CM | POA: Diagnosis not present

## 2017-03-25 DIAGNOSIS — J4520 Mild intermittent asthma, uncomplicated: Secondary | ICD-10-CM | POA: Diagnosis not present

## 2017-03-26 DIAGNOSIS — Z1211 Encounter for screening for malignant neoplasm of colon: Secondary | ICD-10-CM | POA: Diagnosis not present

## 2017-03-26 DIAGNOSIS — I10 Essential (primary) hypertension: Secondary | ICD-10-CM | POA: Diagnosis not present

## 2017-03-26 DIAGNOSIS — Z125 Encounter for screening for malignant neoplasm of prostate: Secondary | ICD-10-CM | POA: Diagnosis not present

## 2017-03-26 DIAGNOSIS — E1142 Type 2 diabetes mellitus with diabetic polyneuropathy: Secondary | ICD-10-CM | POA: Diagnosis not present

## 2017-04-08 DIAGNOSIS — I10 Essential (primary) hypertension: Secondary | ICD-10-CM | POA: Diagnosis not present

## 2017-04-08 DIAGNOSIS — M545 Low back pain: Secondary | ICD-10-CM | POA: Diagnosis not present

## 2017-04-08 DIAGNOSIS — E1142 Type 2 diabetes mellitus with diabetic polyneuropathy: Secondary | ICD-10-CM | POA: Diagnosis not present

## 2017-04-08 DIAGNOSIS — J4520 Mild intermittent asthma, uncomplicated: Secondary | ICD-10-CM | POA: Diagnosis not present

## 2017-04-22 DIAGNOSIS — G4733 Obstructive sleep apnea (adult) (pediatric): Secondary | ICD-10-CM | POA: Diagnosis not present

## 2017-04-22 DIAGNOSIS — J449 Chronic obstructive pulmonary disease, unspecified: Secondary | ICD-10-CM | POA: Diagnosis not present

## 2017-05-23 DIAGNOSIS — G4733 Obstructive sleep apnea (adult) (pediatric): Secondary | ICD-10-CM | POA: Diagnosis not present

## 2017-05-23 DIAGNOSIS — J449 Chronic obstructive pulmonary disease, unspecified: Secondary | ICD-10-CM | POA: Diagnosis not present

## 2017-05-27 DIAGNOSIS — E1142 Type 2 diabetes mellitus with diabetic polyneuropathy: Secondary | ICD-10-CM | POA: Diagnosis not present

## 2017-05-27 DIAGNOSIS — I10 Essential (primary) hypertension: Secondary | ICD-10-CM | POA: Diagnosis not present

## 2017-05-27 DIAGNOSIS — Z0000 Encounter for general adult medical examination without abnormal findings: Secondary | ICD-10-CM | POA: Diagnosis not present

## 2017-05-27 DIAGNOSIS — Z1389 Encounter for screening for other disorder: Secondary | ICD-10-CM | POA: Diagnosis not present

## 2017-05-27 DIAGNOSIS — M545 Low back pain: Secondary | ICD-10-CM | POA: Diagnosis not present

## 2017-05-27 DIAGNOSIS — J4520 Mild intermittent asthma, uncomplicated: Secondary | ICD-10-CM | POA: Diagnosis not present

## 2017-06-20 DIAGNOSIS — J449 Chronic obstructive pulmonary disease, unspecified: Secondary | ICD-10-CM | POA: Diagnosis not present

## 2017-06-20 DIAGNOSIS — G4733 Obstructive sleep apnea (adult) (pediatric): Secondary | ICD-10-CM | POA: Diagnosis not present

## 2017-07-17 DIAGNOSIS — J4520 Mild intermittent asthma, uncomplicated: Secondary | ICD-10-CM | POA: Diagnosis not present

## 2017-07-17 DIAGNOSIS — E1142 Type 2 diabetes mellitus with diabetic polyneuropathy: Secondary | ICD-10-CM | POA: Diagnosis not present

## 2017-07-17 DIAGNOSIS — M545 Low back pain: Secondary | ICD-10-CM | POA: Diagnosis not present

## 2017-07-17 DIAGNOSIS — I10 Essential (primary) hypertension: Secondary | ICD-10-CM | POA: Diagnosis not present

## 2017-07-21 DIAGNOSIS — J449 Chronic obstructive pulmonary disease, unspecified: Secondary | ICD-10-CM | POA: Diagnosis not present

## 2017-07-21 DIAGNOSIS — G4733 Obstructive sleep apnea (adult) (pediatric): Secondary | ICD-10-CM | POA: Diagnosis not present

## 2017-07-24 DIAGNOSIS — Z0000 Encounter for general adult medical examination without abnormal findings: Secondary | ICD-10-CM | POA: Diagnosis not present

## 2017-07-24 DIAGNOSIS — J4520 Mild intermittent asthma, uncomplicated: Secondary | ICD-10-CM | POA: Diagnosis not present

## 2017-07-24 DIAGNOSIS — M79604 Pain in right leg: Secondary | ICD-10-CM | POA: Diagnosis not present

## 2017-07-24 DIAGNOSIS — I10 Essential (primary) hypertension: Secondary | ICD-10-CM | POA: Diagnosis not present

## 2017-07-24 DIAGNOSIS — E1142 Type 2 diabetes mellitus with diabetic polyneuropathy: Secondary | ICD-10-CM | POA: Diagnosis not present

## 2017-07-24 DIAGNOSIS — M545 Low back pain: Secondary | ICD-10-CM | POA: Diagnosis not present

## 2017-08-20 DIAGNOSIS — J449 Chronic obstructive pulmonary disease, unspecified: Secondary | ICD-10-CM | POA: Diagnosis not present

## 2017-08-20 DIAGNOSIS — G4733 Obstructive sleep apnea (adult) (pediatric): Secondary | ICD-10-CM | POA: Diagnosis not present

## 2017-09-20 DIAGNOSIS — G4733 Obstructive sleep apnea (adult) (pediatric): Secondary | ICD-10-CM | POA: Diagnosis not present

## 2017-09-20 DIAGNOSIS — J449 Chronic obstructive pulmonary disease, unspecified: Secondary | ICD-10-CM | POA: Diagnosis not present

## 2017-09-23 DIAGNOSIS — I10 Essential (primary) hypertension: Secondary | ICD-10-CM | POA: Diagnosis not present

## 2017-09-23 DIAGNOSIS — M545 Low back pain: Secondary | ICD-10-CM | POA: Diagnosis not present

## 2017-09-23 DIAGNOSIS — E1142 Type 2 diabetes mellitus with diabetic polyneuropathy: Secondary | ICD-10-CM | POA: Diagnosis not present

## 2017-09-23 DIAGNOSIS — J4520 Mild intermittent asthma, uncomplicated: Secondary | ICD-10-CM | POA: Diagnosis not present

## 2017-10-02 DIAGNOSIS — I10 Essential (primary) hypertension: Secondary | ICD-10-CM | POA: Diagnosis not present

## 2017-10-02 DIAGNOSIS — E1142 Type 2 diabetes mellitus with diabetic polyneuropathy: Secondary | ICD-10-CM | POA: Diagnosis not present

## 2017-10-02 DIAGNOSIS — M545 Low back pain: Secondary | ICD-10-CM | POA: Diagnosis not present

## 2017-10-03 DIAGNOSIS — I10 Essential (primary) hypertension: Secondary | ICD-10-CM | POA: Diagnosis not present

## 2017-10-03 DIAGNOSIS — E1142 Type 2 diabetes mellitus with diabetic polyneuropathy: Secondary | ICD-10-CM | POA: Diagnosis not present

## 2017-10-20 DIAGNOSIS — G4733 Obstructive sleep apnea (adult) (pediatric): Secondary | ICD-10-CM | POA: Diagnosis not present

## 2017-10-20 DIAGNOSIS — J449 Chronic obstructive pulmonary disease, unspecified: Secondary | ICD-10-CM | POA: Diagnosis not present

## 2017-11-06 DIAGNOSIS — I10 Essential (primary) hypertension: Secondary | ICD-10-CM | POA: Diagnosis not present

## 2017-11-06 DIAGNOSIS — E1142 Type 2 diabetes mellitus with diabetic polyneuropathy: Secondary | ICD-10-CM | POA: Diagnosis not present

## 2017-11-06 DIAGNOSIS — M545 Low back pain: Secondary | ICD-10-CM | POA: Diagnosis not present

## 2017-11-20 DIAGNOSIS — J449 Chronic obstructive pulmonary disease, unspecified: Secondary | ICD-10-CM | POA: Diagnosis not present

## 2017-11-20 DIAGNOSIS — G4733 Obstructive sleep apnea (adult) (pediatric): Secondary | ICD-10-CM | POA: Diagnosis not present

## 2017-11-24 DIAGNOSIS — E1142 Type 2 diabetes mellitus with diabetic polyneuropathy: Secondary | ICD-10-CM | POA: Diagnosis not present

## 2017-11-24 DIAGNOSIS — M545 Low back pain: Secondary | ICD-10-CM | POA: Diagnosis not present

## 2017-11-24 DIAGNOSIS — I10 Essential (primary) hypertension: Secondary | ICD-10-CM | POA: Diagnosis not present

## 2017-11-24 DIAGNOSIS — J4520 Mild intermittent asthma, uncomplicated: Secondary | ICD-10-CM | POA: Diagnosis not present

## 2017-12-08 DIAGNOSIS — M545 Low back pain: Secondary | ICD-10-CM | POA: Diagnosis not present

## 2017-12-08 DIAGNOSIS — J4520 Mild intermittent asthma, uncomplicated: Secondary | ICD-10-CM | POA: Diagnosis not present

## 2017-12-08 DIAGNOSIS — I10 Essential (primary) hypertension: Secondary | ICD-10-CM | POA: Diagnosis not present

## 2017-12-08 DIAGNOSIS — E1142 Type 2 diabetes mellitus with diabetic polyneuropathy: Secondary | ICD-10-CM | POA: Diagnosis not present

## 2017-12-21 DIAGNOSIS — J449 Chronic obstructive pulmonary disease, unspecified: Secondary | ICD-10-CM | POA: Diagnosis not present

## 2017-12-21 DIAGNOSIS — G4733 Obstructive sleep apnea (adult) (pediatric): Secondary | ICD-10-CM | POA: Diagnosis not present

## 2017-12-30 DIAGNOSIS — M545 Low back pain: Secondary | ICD-10-CM | POA: Diagnosis not present

## 2017-12-30 DIAGNOSIS — I10 Essential (primary) hypertension: Secondary | ICD-10-CM | POA: Diagnosis not present

## 2017-12-30 DIAGNOSIS — J4520 Mild intermittent asthma, uncomplicated: Secondary | ICD-10-CM | POA: Diagnosis not present

## 2017-12-30 DIAGNOSIS — E1142 Type 2 diabetes mellitus with diabetic polyneuropathy: Secondary | ICD-10-CM | POA: Diagnosis not present

## 2018-01-20 DIAGNOSIS — G4733 Obstructive sleep apnea (adult) (pediatric): Secondary | ICD-10-CM | POA: Diagnosis not present

## 2018-01-20 DIAGNOSIS — J449 Chronic obstructive pulmonary disease, unspecified: Secondary | ICD-10-CM | POA: Diagnosis not present

## 2018-01-26 DIAGNOSIS — I10 Essential (primary) hypertension: Secondary | ICD-10-CM | POA: Diagnosis not present

## 2018-01-26 DIAGNOSIS — Z79891 Long term (current) use of opiate analgesic: Secondary | ICD-10-CM | POA: Diagnosis not present

## 2018-01-26 DIAGNOSIS — J4520 Mild intermittent asthma, uncomplicated: Secondary | ICD-10-CM | POA: Diagnosis not present

## 2018-01-26 DIAGNOSIS — E1142 Type 2 diabetes mellitus with diabetic polyneuropathy: Secondary | ICD-10-CM | POA: Diagnosis not present

## 2018-01-26 DIAGNOSIS — M545 Low back pain: Secondary | ICD-10-CM | POA: Diagnosis not present

## 2018-02-16 DIAGNOSIS — M545 Low back pain: Secondary | ICD-10-CM | POA: Diagnosis not present

## 2018-02-16 DIAGNOSIS — E1142 Type 2 diabetes mellitus with diabetic polyneuropathy: Secondary | ICD-10-CM | POA: Diagnosis not present

## 2018-02-16 DIAGNOSIS — I10 Essential (primary) hypertension: Secondary | ICD-10-CM | POA: Diagnosis not present

## 2018-02-20 DIAGNOSIS — G4733 Obstructive sleep apnea (adult) (pediatric): Secondary | ICD-10-CM | POA: Diagnosis not present

## 2018-02-20 DIAGNOSIS — J449 Chronic obstructive pulmonary disease, unspecified: Secondary | ICD-10-CM | POA: Diagnosis not present

## 2018-03-16 DIAGNOSIS — I10 Essential (primary) hypertension: Secondary | ICD-10-CM | POA: Diagnosis not present

## 2018-03-16 DIAGNOSIS — M545 Low back pain: Secondary | ICD-10-CM | POA: Diagnosis not present

## 2018-03-16 DIAGNOSIS — E1142 Type 2 diabetes mellitus with diabetic polyneuropathy: Secondary | ICD-10-CM | POA: Diagnosis not present

## 2018-03-22 DIAGNOSIS — G4733 Obstructive sleep apnea (adult) (pediatric): Secondary | ICD-10-CM | POA: Diagnosis not present

## 2018-03-22 DIAGNOSIS — J449 Chronic obstructive pulmonary disease, unspecified: Secondary | ICD-10-CM | POA: Diagnosis not present

## 2018-03-26 DIAGNOSIS — I10 Essential (primary) hypertension: Secondary | ICD-10-CM | POA: Diagnosis not present

## 2018-03-26 DIAGNOSIS — M545 Low back pain: Secondary | ICD-10-CM | POA: Diagnosis not present

## 2018-03-26 DIAGNOSIS — J4520 Mild intermittent asthma, uncomplicated: Secondary | ICD-10-CM | POA: Diagnosis not present

## 2018-03-26 DIAGNOSIS — E1142 Type 2 diabetes mellitus with diabetic polyneuropathy: Secondary | ICD-10-CM | POA: Diagnosis not present

## 2018-04-16 DIAGNOSIS — E1142 Type 2 diabetes mellitus with diabetic polyneuropathy: Secondary | ICD-10-CM | POA: Diagnosis not present

## 2018-04-16 DIAGNOSIS — M545 Low back pain: Secondary | ICD-10-CM | POA: Diagnosis not present

## 2018-04-16 DIAGNOSIS — I10 Essential (primary) hypertension: Secondary | ICD-10-CM | POA: Diagnosis not present

## 2018-04-22 DIAGNOSIS — J449 Chronic obstructive pulmonary disease, unspecified: Secondary | ICD-10-CM | POA: Diagnosis not present

## 2018-04-22 DIAGNOSIS — G4733 Obstructive sleep apnea (adult) (pediatric): Secondary | ICD-10-CM | POA: Diagnosis not present

## 2018-05-14 DIAGNOSIS — I10 Essential (primary) hypertension: Secondary | ICD-10-CM | POA: Diagnosis not present

## 2018-05-14 DIAGNOSIS — M545 Low back pain: Secondary | ICD-10-CM | POA: Diagnosis not present

## 2018-05-14 DIAGNOSIS — E1142 Type 2 diabetes mellitus with diabetic polyneuropathy: Secondary | ICD-10-CM | POA: Diagnosis not present

## 2018-05-23 DIAGNOSIS — G4733 Obstructive sleep apnea (adult) (pediatric): Secondary | ICD-10-CM | POA: Diagnosis not present

## 2018-05-23 DIAGNOSIS — J449 Chronic obstructive pulmonary disease, unspecified: Secondary | ICD-10-CM | POA: Diagnosis not present

## 2018-05-26 DIAGNOSIS — M545 Low back pain: Secondary | ICD-10-CM | POA: Diagnosis not present

## 2018-05-26 DIAGNOSIS — E7849 Other hyperlipidemia: Secondary | ICD-10-CM | POA: Diagnosis not present

## 2018-05-26 DIAGNOSIS — I10 Essential (primary) hypertension: Secondary | ICD-10-CM | POA: Diagnosis not present

## 2018-05-26 DIAGNOSIS — Z1389 Encounter for screening for other disorder: Secondary | ICD-10-CM | POA: Diagnosis not present

## 2018-05-26 DIAGNOSIS — Z0000 Encounter for general adult medical examination without abnormal findings: Secondary | ICD-10-CM | POA: Diagnosis not present

## 2018-05-26 DIAGNOSIS — E1142 Type 2 diabetes mellitus with diabetic polyneuropathy: Secondary | ICD-10-CM | POA: Diagnosis not present

## 2018-05-26 DIAGNOSIS — J4520 Mild intermittent asthma, uncomplicated: Secondary | ICD-10-CM | POA: Diagnosis not present

## 2018-06-19 DIAGNOSIS — M545 Low back pain: Secondary | ICD-10-CM | POA: Diagnosis not present

## 2018-06-19 DIAGNOSIS — E1142 Type 2 diabetes mellitus with diabetic polyneuropathy: Secondary | ICD-10-CM | POA: Diagnosis not present

## 2018-06-19 DIAGNOSIS — I10 Essential (primary) hypertension: Secondary | ICD-10-CM | POA: Diagnosis not present

## 2018-06-19 DIAGNOSIS — E7849 Other hyperlipidemia: Secondary | ICD-10-CM | POA: Diagnosis not present

## 2018-06-21 DIAGNOSIS — J449 Chronic obstructive pulmonary disease, unspecified: Secondary | ICD-10-CM | POA: Diagnosis not present

## 2018-06-21 DIAGNOSIS — G4733 Obstructive sleep apnea (adult) (pediatric): Secondary | ICD-10-CM | POA: Diagnosis not present

## 2018-07-02 DIAGNOSIS — M8588 Other specified disorders of bone density and structure, other site: Secondary | ICD-10-CM | POA: Diagnosis not present

## 2018-07-02 DIAGNOSIS — M810 Age-related osteoporosis without current pathological fracture: Secondary | ICD-10-CM | POA: Diagnosis not present

## 2018-07-10 DIAGNOSIS — I10 Essential (primary) hypertension: Secondary | ICD-10-CM | POA: Diagnosis not present

## 2018-07-10 DIAGNOSIS — E1142 Type 2 diabetes mellitus with diabetic polyneuropathy: Secondary | ICD-10-CM | POA: Diagnosis not present

## 2018-07-10 DIAGNOSIS — M545 Low back pain: Secondary | ICD-10-CM | POA: Diagnosis not present

## 2018-07-10 DIAGNOSIS — E7849 Other hyperlipidemia: Secondary | ICD-10-CM | POA: Diagnosis not present

## 2018-07-22 DIAGNOSIS — G4733 Obstructive sleep apnea (adult) (pediatric): Secondary | ICD-10-CM | POA: Diagnosis not present

## 2018-07-22 DIAGNOSIS — J449 Chronic obstructive pulmonary disease, unspecified: Secondary | ICD-10-CM | POA: Diagnosis not present

## 2018-08-07 DIAGNOSIS — E1142 Type 2 diabetes mellitus with diabetic polyneuropathy: Secondary | ICD-10-CM | POA: Diagnosis not present

## 2018-08-07 DIAGNOSIS — I10 Essential (primary) hypertension: Secondary | ICD-10-CM | POA: Diagnosis not present

## 2018-08-07 DIAGNOSIS — M545 Low back pain: Secondary | ICD-10-CM | POA: Diagnosis not present

## 2018-08-07 DIAGNOSIS — E7849 Other hyperlipidemia: Secondary | ICD-10-CM | POA: Diagnosis not present

## 2018-08-17 ENCOUNTER — Other Ambulatory Visit: Payer: Self-pay

## 2018-08-17 NOTE — Patient Outreach (Signed)
[facility]) Care Management  08/17/2018  Christian Aguilar 1955-11-18 [id number]   Medication Adherence call to Mr.[name] HIPPA Compliant Voice message left with a call back number. [name] is showing past due on Metformin 1000 mg and Atorvastatin 40 mg under [employer].   Lillia Abed CPhT Pharmacy Technician [facility] Care Management Direct Dial [phone]  Fax [phone] [email or web address]

## 2018-08-21 DIAGNOSIS — J449 Chronic obstructive pulmonary disease, unspecified: Secondary | ICD-10-CM | POA: Diagnosis not present

## 2018-08-21 DIAGNOSIS — G4733 Obstructive sleep apnea (adult) (pediatric): Secondary | ICD-10-CM | POA: Diagnosis not present

## 2018-08-25 DIAGNOSIS — Z0000 Encounter for general adult medical examination without abnormal findings: Secondary | ICD-10-CM | POA: Diagnosis not present

## 2018-08-25 DIAGNOSIS — J4520 Mild intermittent asthma, uncomplicated: Secondary | ICD-10-CM | POA: Diagnosis not present

## 2018-08-25 DIAGNOSIS — I10 Essential (primary) hypertension: Secondary | ICD-10-CM | POA: Diagnosis not present

## 2018-08-25 DIAGNOSIS — E7849 Other hyperlipidemia: Secondary | ICD-10-CM | POA: Diagnosis not present

## 2018-08-25 DIAGNOSIS — E1142 Type 2 diabetes mellitus with diabetic polyneuropathy: Secondary | ICD-10-CM | POA: Diagnosis not present

## 2018-08-25 DIAGNOSIS — Z1389 Encounter for screening for other disorder: Secondary | ICD-10-CM | POA: Diagnosis not present

## 2018-08-25 DIAGNOSIS — M545 Low back pain: Secondary | ICD-10-CM | POA: Diagnosis not present

## 2018-08-31 ENCOUNTER — Other Ambulatory Visit: Payer: Self-pay

## 2018-08-31 NOTE — Patient Outreach (Signed)
[facility]) Care Management  08/31/2018  Christian Aguilar January 06, 1956 [id number]   Medication Adherence call to [name] Hippa Identifiers Verify spoke with patient he is due on Metformin 1000 mg and Atorvastatin 40 mg patient explain he is taking 1 tablet daily on Atorvastatin on Metformin he is taking 1 tablet 2 times daily patient said he still has plenty of medication and does not need any at this time. [name] is showing past due under [employer].   Lillia Abed CPhT Pharmacy Technician [facility] Care Management Direct Dial [phone]  Fax [phone] [email or web address]

## 2018-09-08 DIAGNOSIS — E1142 Type 2 diabetes mellitus with diabetic polyneuropathy: Secondary | ICD-10-CM | POA: Diagnosis not present

## 2018-09-08 DIAGNOSIS — I10 Essential (primary) hypertension: Secondary | ICD-10-CM | POA: Diagnosis not present

## 2018-09-08 DIAGNOSIS — M545 Low back pain: Secondary | ICD-10-CM | POA: Diagnosis not present

## 2018-09-08 DIAGNOSIS — E7849 Other hyperlipidemia: Secondary | ICD-10-CM | POA: Diagnosis not present

## 2018-09-21 DIAGNOSIS — G4733 Obstructive sleep apnea (adult) (pediatric): Secondary | ICD-10-CM | POA: Diagnosis not present

## 2018-09-21 DIAGNOSIS — J449 Chronic obstructive pulmonary disease, unspecified: Secondary | ICD-10-CM | POA: Diagnosis not present

## 2018-10-15 DIAGNOSIS — E1142 Type 2 diabetes mellitus with diabetic polyneuropathy: Secondary | ICD-10-CM | POA: Diagnosis not present

## 2018-10-15 DIAGNOSIS — M545 Low back pain: Secondary | ICD-10-CM | POA: Diagnosis not present

## 2018-10-15 DIAGNOSIS — I10 Essential (primary) hypertension: Secondary | ICD-10-CM | POA: Diagnosis not present

## 2018-10-15 DIAGNOSIS — E7849 Other hyperlipidemia: Secondary | ICD-10-CM | POA: Diagnosis not present

## 2018-10-21 DIAGNOSIS — J449 Chronic obstructive pulmonary disease, unspecified: Secondary | ICD-10-CM | POA: Diagnosis not present

## 2018-10-21 DIAGNOSIS — G4733 Obstructive sleep apnea (adult) (pediatric): Secondary | ICD-10-CM | POA: Diagnosis not present

## 2018-11-12 DIAGNOSIS — E1142 Type 2 diabetes mellitus with diabetic polyneuropathy: Secondary | ICD-10-CM | POA: Diagnosis not present

## 2018-11-12 DIAGNOSIS — E7849 Other hyperlipidemia: Secondary | ICD-10-CM | POA: Diagnosis not present

## 2018-11-12 DIAGNOSIS — M545 Low back pain: Secondary | ICD-10-CM | POA: Diagnosis not present

## 2018-11-12 DIAGNOSIS — I10 Essential (primary) hypertension: Secondary | ICD-10-CM | POA: Diagnosis not present

## 2018-11-13 ENCOUNTER — Other Ambulatory Visit: Payer: Self-pay

## 2018-11-13 NOTE — Patient Outreach (Signed)
[facility]) Care Management  11/13/2018  [name] February 29, 1956 [id number]  Medication Adherence call to Mr. Sirius Woodford HIPPA Compliant Voice message left with a call back number. Mr. Whitenight is showing past due on Lisinopril/Hctz 20/12.5 mg under [place].    [place] Management Direct Dial [phone]  Fax [phone] [email or web address]

## 2018-11-21 DIAGNOSIS — J449 Chronic obstructive pulmonary disease, unspecified: Secondary | ICD-10-CM | POA: Diagnosis not present

## 2018-11-21 DIAGNOSIS — G4733 Obstructive sleep apnea (adult) (pediatric): Secondary | ICD-10-CM | POA: Diagnosis not present

## 2018-11-24 ENCOUNTER — Other Ambulatory Visit: Payer: Self-pay

## 2018-11-24 NOTE — Patient Outreach (Signed)
[facility] [place] [facility]) Care Management  11/24/2018  Christian Aguilar 12-02-1955 [id number]   Medication Adherence call to Christian Aguilar HIPPA Compliant Voice message left with a call back number. Christian Aguilar is showing past due on Lisinopril/Hctz 20/12.5 mg under [place].   [place] Management Direct Dial [phone]  Fax [phone] [name]

## 2018-11-25 DIAGNOSIS — E7849 Other hyperlipidemia: Secondary | ICD-10-CM | POA: Diagnosis not present

## 2018-11-25 DIAGNOSIS — I10 Essential (primary) hypertension: Secondary | ICD-10-CM | POA: Diagnosis not present

## 2018-11-25 DIAGNOSIS — J4520 Mild intermittent asthma, uncomplicated: Secondary | ICD-10-CM | POA: Diagnosis not present

## 2018-11-25 DIAGNOSIS — M545 Low back pain: Secondary | ICD-10-CM | POA: Diagnosis not present

## 2018-11-25 DIAGNOSIS — E1121 Type 2 diabetes mellitus with diabetic nephropathy: Secondary | ICD-10-CM | POA: Diagnosis not present

## 2018-12-16 DIAGNOSIS — I10 Essential (primary) hypertension: Secondary | ICD-10-CM | POA: Diagnosis not present

## 2018-12-16 DIAGNOSIS — M545 Low back pain: Secondary | ICD-10-CM | POA: Diagnosis not present

## 2018-12-16 DIAGNOSIS — J4520 Mild intermittent asthma, uncomplicated: Secondary | ICD-10-CM | POA: Diagnosis not present

## 2018-12-16 DIAGNOSIS — E7849 Other hyperlipidemia: Secondary | ICD-10-CM | POA: Diagnosis not present

## 2018-12-16 DIAGNOSIS — E1121 Type 2 diabetes mellitus with diabetic nephropathy: Secondary | ICD-10-CM | POA: Diagnosis not present

## 2018-12-22 DIAGNOSIS — G4733 Obstructive sleep apnea (adult) (pediatric): Secondary | ICD-10-CM | POA: Diagnosis not present

## 2018-12-22 DIAGNOSIS — J449 Chronic obstructive pulmonary disease, unspecified: Secondary | ICD-10-CM | POA: Diagnosis not present

## 2019-01-13 DIAGNOSIS — E7849 Other hyperlipidemia: Secondary | ICD-10-CM | POA: Diagnosis not present

## 2019-01-13 DIAGNOSIS — M545 Low back pain: Secondary | ICD-10-CM | POA: Diagnosis not present

## 2019-01-13 DIAGNOSIS — I10 Essential (primary) hypertension: Secondary | ICD-10-CM | POA: Diagnosis not present

## 2019-01-13 DIAGNOSIS — E1121 Type 2 diabetes mellitus with diabetic nephropathy: Secondary | ICD-10-CM | POA: Diagnosis not present

## 2019-01-13 DIAGNOSIS — J4520 Mild intermittent asthma, uncomplicated: Secondary | ICD-10-CM | POA: Diagnosis not present

## 2019-01-21 DIAGNOSIS — J449 Chronic obstructive pulmonary disease, unspecified: Secondary | ICD-10-CM | POA: Diagnosis not present

## 2019-01-21 DIAGNOSIS — G4733 Obstructive sleep apnea (adult) (pediatric): Secondary | ICD-10-CM | POA: Diagnosis not present

## 2019-01-25 DIAGNOSIS — E782 Mixed hyperlipidemia: Secondary | ICD-10-CM | POA: Diagnosis not present

## 2019-01-25 DIAGNOSIS — I10 Essential (primary) hypertension: Secondary | ICD-10-CM | POA: Diagnosis not present

## 2019-01-25 DIAGNOSIS — E1121 Type 2 diabetes mellitus with diabetic nephropathy: Secondary | ICD-10-CM | POA: Diagnosis not present

## 2019-01-25 DIAGNOSIS — J449 Chronic obstructive pulmonary disease, unspecified: Secondary | ICD-10-CM | POA: Diagnosis not present

## 2019-01-25 DIAGNOSIS — N182 Chronic kidney disease, stage 2 (mild): Secondary | ICD-10-CM | POA: Diagnosis not present

## 2019-02-10 DIAGNOSIS — N182 Chronic kidney disease, stage 2 (mild): Secondary | ICD-10-CM | POA: Diagnosis not present

## 2019-02-10 DIAGNOSIS — I10 Essential (primary) hypertension: Secondary | ICD-10-CM | POA: Diagnosis not present

## 2019-02-10 DIAGNOSIS — E1121 Type 2 diabetes mellitus with diabetic nephropathy: Secondary | ICD-10-CM | POA: Diagnosis not present

## 2019-02-10 DIAGNOSIS — J449 Chronic obstructive pulmonary disease, unspecified: Secondary | ICD-10-CM | POA: Diagnosis not present

## 2019-02-10 DIAGNOSIS — E782 Mixed hyperlipidemia: Secondary | ICD-10-CM | POA: Diagnosis not present

## 2019-02-21 DIAGNOSIS — J449 Chronic obstructive pulmonary disease, unspecified: Secondary | ICD-10-CM | POA: Diagnosis not present

## 2019-02-21 DIAGNOSIS — G4733 Obstructive sleep apnea (adult) (pediatric): Secondary | ICD-10-CM | POA: Diagnosis not present

## 2019-03-01 ENCOUNTER — Other Ambulatory Visit: Payer: Self-pay

## 2019-03-01 NOTE — Patient Outreach (Signed)
[facility]) Care Management  03/01/2019  Christian Aguilar 1955-12-20 [id number]   Medication Adherence call to Christian Aguilar Hippa Identifiers Verify spoke with patient he is past due on Atorvastatin 40 mg and Metformin 1000 mg,patient explain he takes both medication but he had last his medication and the pharmacy fill the prescription,patient found the prescription he last ,patient has enough until the end of the month,Christian Aguilar is showing past due under [place].    [place] Management Direct Dial [phone]  Fax [phone] [email or web address]

## 2019-03-11 DIAGNOSIS — I10 Essential (primary) hypertension: Secondary | ICD-10-CM | POA: Diagnosis not present

## 2019-03-11 DIAGNOSIS — E1121 Type 2 diabetes mellitus with diabetic nephropathy: Secondary | ICD-10-CM | POA: Diagnosis not present

## 2019-03-11 DIAGNOSIS — J449 Chronic obstructive pulmonary disease, unspecified: Secondary | ICD-10-CM | POA: Diagnosis not present

## 2019-03-11 DIAGNOSIS — E782 Mixed hyperlipidemia: Secondary | ICD-10-CM | POA: Diagnosis not present

## 2019-03-11 DIAGNOSIS — N182 Chronic kidney disease, stage 2 (mild): Secondary | ICD-10-CM | POA: Diagnosis not present

## 2019-03-23 ENCOUNTER — Other Ambulatory Visit: Payer: Self-pay

## 2019-03-23 DIAGNOSIS — J449 Chronic obstructive pulmonary disease, unspecified: Secondary | ICD-10-CM | POA: Diagnosis not present

## 2019-03-23 DIAGNOSIS — G4733 Obstructive sleep apnea (adult) (pediatric): Secondary | ICD-10-CM | POA: Diagnosis not present

## 2019-03-23 NOTE — Patient Outreach (Signed)
[place] [facility]) Care Management  03/23/2019  Christian Aguilar 12-20-1955 [id number]   Medication Adherence call to Mr. Christian Aguilar Telephone call to Patient regarding Medication Adherence unable to reach patient. Christian Aguilar is past due on Atorvastatin 40 mg and Metformin 1000 mg under [place].   [place] Management Direct Dial [phone]  Fax [phone] [email or web address]

## 2019-03-24 DIAGNOSIS — J449 Chronic obstructive pulmonary disease, unspecified: Secondary | ICD-10-CM | POA: Diagnosis not present

## 2019-03-24 DIAGNOSIS — I10 Essential (primary) hypertension: Secondary | ICD-10-CM | POA: Diagnosis not present

## 2019-03-24 DIAGNOSIS — E1121 Type 2 diabetes mellitus with diabetic nephropathy: Secondary | ICD-10-CM | POA: Diagnosis not present

## 2019-03-24 DIAGNOSIS — E782 Mixed hyperlipidemia: Secondary | ICD-10-CM | POA: Diagnosis not present

## 2019-03-24 DIAGNOSIS — N182 Chronic kidney disease, stage 2 (mild): Secondary | ICD-10-CM | POA: Diagnosis not present

## 2019-04-14 DIAGNOSIS — E782 Mixed hyperlipidemia: Secondary | ICD-10-CM | POA: Diagnosis not present

## 2019-04-14 DIAGNOSIS — N182 Chronic kidney disease, stage 2 (mild): Secondary | ICD-10-CM | POA: Diagnosis not present

## 2019-04-14 DIAGNOSIS — J449 Chronic obstructive pulmonary disease, unspecified: Secondary | ICD-10-CM | POA: Diagnosis not present

## 2019-04-14 DIAGNOSIS — I10 Essential (primary) hypertension: Secondary | ICD-10-CM | POA: Diagnosis not present

## 2019-04-14 DIAGNOSIS — E1121 Type 2 diabetes mellitus with diabetic nephropathy: Secondary | ICD-10-CM | POA: Diagnosis not present

## 2019-04-23 DIAGNOSIS — G4733 Obstructive sleep apnea (adult) (pediatric): Secondary | ICD-10-CM | POA: Diagnosis not present

## 2019-04-23 DIAGNOSIS — J449 Chronic obstructive pulmonary disease, unspecified: Secondary | ICD-10-CM | POA: Diagnosis not present

## 2019-05-14 DIAGNOSIS — E1121 Type 2 diabetes mellitus with diabetic nephropathy: Secondary | ICD-10-CM | POA: Diagnosis not present

## 2019-05-14 DIAGNOSIS — I10 Essential (primary) hypertension: Secondary | ICD-10-CM | POA: Diagnosis not present

## 2019-05-14 DIAGNOSIS — N182 Chronic kidney disease, stage 2 (mild): Secondary | ICD-10-CM | POA: Diagnosis not present

## 2019-05-14 DIAGNOSIS — E782 Mixed hyperlipidemia: Secondary | ICD-10-CM | POA: Diagnosis not present

## 2019-05-14 DIAGNOSIS — J449 Chronic obstructive pulmonary disease, unspecified: Secondary | ICD-10-CM | POA: Diagnosis not present

## 2019-05-24 DIAGNOSIS — G4733 Obstructive sleep apnea (adult) (pediatric): Secondary | ICD-10-CM | POA: Diagnosis not present

## 2019-05-24 DIAGNOSIS — J449 Chronic obstructive pulmonary disease, unspecified: Secondary | ICD-10-CM | POA: Diagnosis not present

## 2019-05-25 DIAGNOSIS — E782 Mixed hyperlipidemia: Secondary | ICD-10-CM | POA: Diagnosis not present

## 2019-05-25 DIAGNOSIS — J4520 Mild intermittent asthma, uncomplicated: Secondary | ICD-10-CM | POA: Diagnosis not present

## 2019-05-25 DIAGNOSIS — E1121 Type 2 diabetes mellitus with diabetic nephropathy: Secondary | ICD-10-CM | POA: Diagnosis not present

## 2019-05-25 DIAGNOSIS — J449 Chronic obstructive pulmonary disease, unspecified: Secondary | ICD-10-CM | POA: Diagnosis not present

## 2019-05-25 DIAGNOSIS — N182 Chronic kidney disease, stage 2 (mild): Secondary | ICD-10-CM | POA: Diagnosis not present

## 2019-05-25 DIAGNOSIS — I10 Essential (primary) hypertension: Secondary | ICD-10-CM | POA: Diagnosis not present

## 2019-06-21 DIAGNOSIS — G4733 Obstructive sleep apnea (adult) (pediatric): Secondary | ICD-10-CM | POA: Diagnosis not present

## 2019-06-21 DIAGNOSIS — J449 Chronic obstructive pulmonary disease, unspecified: Secondary | ICD-10-CM | POA: Diagnosis not present

## 2019-07-07 DIAGNOSIS — E1121 Type 2 diabetes mellitus with diabetic nephropathy: Secondary | ICD-10-CM | POA: Diagnosis not present

## 2019-07-07 DIAGNOSIS — E782 Mixed hyperlipidemia: Secondary | ICD-10-CM | POA: Diagnosis not present

## 2019-07-07 DIAGNOSIS — J449 Chronic obstructive pulmonary disease, unspecified: Secondary | ICD-10-CM | POA: Diagnosis not present

## 2019-07-07 DIAGNOSIS — I10 Essential (primary) hypertension: Secondary | ICD-10-CM | POA: Diagnosis not present

## 2019-07-07 DIAGNOSIS — N182 Chronic kidney disease, stage 2 (mild): Secondary | ICD-10-CM | POA: Diagnosis not present

## 2019-07-22 DIAGNOSIS — G4733 Obstructive sleep apnea (adult) (pediatric): Secondary | ICD-10-CM | POA: Diagnosis not present

## 2019-07-22 DIAGNOSIS — J449 Chronic obstructive pulmonary disease, unspecified: Secondary | ICD-10-CM | POA: Diagnosis not present

## 2019-07-27 DIAGNOSIS — N182 Chronic kidney disease, stage 2 (mild): Secondary | ICD-10-CM | POA: Diagnosis not present

## 2019-07-27 DIAGNOSIS — E1121 Type 2 diabetes mellitus with diabetic nephropathy: Secondary | ICD-10-CM | POA: Diagnosis not present

## 2019-07-27 DIAGNOSIS — Z0000 Encounter for general adult medical examination without abnormal findings: Secondary | ICD-10-CM | POA: Diagnosis not present

## 2019-07-27 DIAGNOSIS — J449 Chronic obstructive pulmonary disease, unspecified: Secondary | ICD-10-CM | POA: Diagnosis not present

## 2019-07-27 DIAGNOSIS — I10 Essential (primary) hypertension: Secondary | ICD-10-CM | POA: Diagnosis not present

## 2019-07-27 DIAGNOSIS — E782 Mixed hyperlipidemia: Secondary | ICD-10-CM | POA: Diagnosis not present

## 2019-08-18 DIAGNOSIS — I10 Essential (primary) hypertension: Secondary | ICD-10-CM | POA: Diagnosis not present

## 2019-08-18 DIAGNOSIS — N182 Chronic kidney disease, stage 2 (mild): Secondary | ICD-10-CM | POA: Diagnosis not present

## 2019-08-18 DIAGNOSIS — E1121 Type 2 diabetes mellitus with diabetic nephropathy: Secondary | ICD-10-CM | POA: Diagnosis not present

## 2019-08-18 DIAGNOSIS — J449 Chronic obstructive pulmonary disease, unspecified: Secondary | ICD-10-CM | POA: Diagnosis not present

## 2019-08-18 DIAGNOSIS — E782 Mixed hyperlipidemia: Secondary | ICD-10-CM | POA: Diagnosis not present

## 2019-08-21 DIAGNOSIS — G4733 Obstructive sleep apnea (adult) (pediatric): Secondary | ICD-10-CM | POA: Diagnosis not present

## 2019-08-21 DIAGNOSIS — J449 Chronic obstructive pulmonary disease, unspecified: Secondary | ICD-10-CM | POA: Diagnosis not present

## 2019-08-30 DIAGNOSIS — E782 Mixed hyperlipidemia: Secondary | ICD-10-CM | POA: Diagnosis not present

## 2019-08-30 DIAGNOSIS — J449 Chronic obstructive pulmonary disease, unspecified: Secondary | ICD-10-CM | POA: Diagnosis not present

## 2019-08-30 DIAGNOSIS — I10 Essential (primary) hypertension: Secondary | ICD-10-CM | POA: Diagnosis not present

## 2019-08-30 DIAGNOSIS — N182 Chronic kidney disease, stage 2 (mild): Secondary | ICD-10-CM | POA: Diagnosis not present

## 2019-08-30 DIAGNOSIS — E1121 Type 2 diabetes mellitus with diabetic nephropathy: Secondary | ICD-10-CM | POA: Diagnosis not present

## 2019-09-21 DIAGNOSIS — J449 Chronic obstructive pulmonary disease, unspecified: Secondary | ICD-10-CM | POA: Diagnosis not present

## 2019-09-21 DIAGNOSIS — G4733 Obstructive sleep apnea (adult) (pediatric): Secondary | ICD-10-CM | POA: Diagnosis not present

## 2019-09-22 DIAGNOSIS — E782 Mixed hyperlipidemia: Secondary | ICD-10-CM | POA: Diagnosis not present

## 2019-09-22 DIAGNOSIS — Z0000 Encounter for general adult medical examination without abnormal findings: Secondary | ICD-10-CM | POA: Diagnosis not present

## 2019-09-22 DIAGNOSIS — N182 Chronic kidney disease, stage 2 (mild): Secondary | ICD-10-CM | POA: Diagnosis not present

## 2019-09-22 DIAGNOSIS — J449 Chronic obstructive pulmonary disease, unspecified: Secondary | ICD-10-CM | POA: Diagnosis not present

## 2019-09-22 DIAGNOSIS — I10 Essential (primary) hypertension: Secondary | ICD-10-CM | POA: Diagnosis not present

## 2019-09-22 DIAGNOSIS — E1121 Type 2 diabetes mellitus with diabetic nephropathy: Secondary | ICD-10-CM | POA: Diagnosis not present

## 2019-10-08 DIAGNOSIS — E1121 Type 2 diabetes mellitus with diabetic nephropathy: Secondary | ICD-10-CM | POA: Diagnosis not present

## 2019-10-08 DIAGNOSIS — E782 Mixed hyperlipidemia: Secondary | ICD-10-CM | POA: Diagnosis not present

## 2019-10-08 DIAGNOSIS — I10 Essential (primary) hypertension: Secondary | ICD-10-CM | POA: Diagnosis not present

## 2019-10-08 DIAGNOSIS — J449 Chronic obstructive pulmonary disease, unspecified: Secondary | ICD-10-CM | POA: Diagnosis not present

## 2019-10-21 DIAGNOSIS — G4733 Obstructive sleep apnea (adult) (pediatric): Secondary | ICD-10-CM | POA: Diagnosis not present

## 2019-10-21 DIAGNOSIS — J449 Chronic obstructive pulmonary disease, unspecified: Secondary | ICD-10-CM | POA: Diagnosis not present

## 2019-11-04 DIAGNOSIS — I10 Essential (primary) hypertension: Secondary | ICD-10-CM | POA: Diagnosis not present

## 2019-11-04 DIAGNOSIS — J449 Chronic obstructive pulmonary disease, unspecified: Secondary | ICD-10-CM | POA: Diagnosis not present

## 2019-11-04 DIAGNOSIS — E1121 Type 2 diabetes mellitus with diabetic nephropathy: Secondary | ICD-10-CM | POA: Diagnosis not present

## 2019-11-04 DIAGNOSIS — E782 Mixed hyperlipidemia: Secondary | ICD-10-CM | POA: Diagnosis not present

## 2019-11-21 DIAGNOSIS — G4733 Obstructive sleep apnea (adult) (pediatric): Secondary | ICD-10-CM | POA: Diagnosis not present

## 2019-11-21 DIAGNOSIS — J449 Chronic obstructive pulmonary disease, unspecified: Secondary | ICD-10-CM | POA: Diagnosis not present

## 2019-11-22 DIAGNOSIS — J449 Chronic obstructive pulmonary disease, unspecified: Secondary | ICD-10-CM | POA: Diagnosis not present

## 2019-11-22 DIAGNOSIS — N182 Chronic kidney disease, stage 2 (mild): Secondary | ICD-10-CM | POA: Diagnosis not present

## 2019-11-22 DIAGNOSIS — E1121 Type 2 diabetes mellitus with diabetic nephropathy: Secondary | ICD-10-CM | POA: Diagnosis not present

## 2019-11-22 DIAGNOSIS — I10 Essential (primary) hypertension: Secondary | ICD-10-CM | POA: Diagnosis not present

## 2019-11-22 DIAGNOSIS — E782 Mixed hyperlipidemia: Secondary | ICD-10-CM | POA: Diagnosis not present

## 2019-12-08 DIAGNOSIS — J449 Chronic obstructive pulmonary disease, unspecified: Secondary | ICD-10-CM | POA: Diagnosis not present

## 2019-12-08 DIAGNOSIS — I10 Essential (primary) hypertension: Secondary | ICD-10-CM | POA: Diagnosis not present

## 2019-12-08 DIAGNOSIS — N182 Chronic kidney disease, stage 2 (mild): Secondary | ICD-10-CM | POA: Diagnosis not present

## 2019-12-08 DIAGNOSIS — E1121 Type 2 diabetes mellitus with diabetic nephropathy: Secondary | ICD-10-CM | POA: Diagnosis not present

## 2019-12-08 DIAGNOSIS — E782 Mixed hyperlipidemia: Secondary | ICD-10-CM | POA: Diagnosis not present

## 2019-12-22 DIAGNOSIS — J449 Chronic obstructive pulmonary disease, unspecified: Secondary | ICD-10-CM | POA: Diagnosis not present

## 2019-12-22 DIAGNOSIS — G4733 Obstructive sleep apnea (adult) (pediatric): Secondary | ICD-10-CM | POA: Diagnosis not present

## 2020-01-12 DIAGNOSIS — I10 Essential (primary) hypertension: Secondary | ICD-10-CM | POA: Diagnosis not present

## 2020-01-12 DIAGNOSIS — J449 Chronic obstructive pulmonary disease, unspecified: Secondary | ICD-10-CM | POA: Diagnosis not present

## 2020-01-12 DIAGNOSIS — E782 Mixed hyperlipidemia: Secondary | ICD-10-CM | POA: Diagnosis not present

## 2020-01-12 DIAGNOSIS — N182 Chronic kidney disease, stage 2 (mild): Secondary | ICD-10-CM | POA: Diagnosis not present

## 2020-01-12 DIAGNOSIS — E1121 Type 2 diabetes mellitus with diabetic nephropathy: Secondary | ICD-10-CM | POA: Diagnosis not present

## 2020-01-20 DIAGNOSIS — E1121 Type 2 diabetes mellitus with diabetic nephropathy: Secondary | ICD-10-CM | POA: Diagnosis not present

## 2020-01-20 DIAGNOSIS — E782 Mixed hyperlipidemia: Secondary | ICD-10-CM | POA: Diagnosis not present

## 2020-01-20 DIAGNOSIS — J449 Chronic obstructive pulmonary disease, unspecified: Secondary | ICD-10-CM | POA: Diagnosis not present

## 2020-01-20 DIAGNOSIS — I10 Essential (primary) hypertension: Secondary | ICD-10-CM | POA: Diagnosis not present

## 2020-01-20 DIAGNOSIS — N182 Chronic kidney disease, stage 2 (mild): Secondary | ICD-10-CM | POA: Diagnosis not present

## 2020-05-18 DIAGNOSIS — E782 Mixed hyperlipidemia: Secondary | ICD-10-CM | POA: Diagnosis not present

## 2020-05-18 DIAGNOSIS — E1121 Type 2 diabetes mellitus with diabetic nephropathy: Secondary | ICD-10-CM | POA: Diagnosis not present

## 2020-05-18 DIAGNOSIS — J449 Chronic obstructive pulmonary disease, unspecified: Secondary | ICD-10-CM | POA: Diagnosis not present

## 2020-05-18 DIAGNOSIS — I10 Essential (primary) hypertension: Secondary | ICD-10-CM | POA: Diagnosis not present

## 2020-05-18 DIAGNOSIS — Z6820 Body mass index (BMI) 20.0-20.9, adult: Secondary | ICD-10-CM | POA: Diagnosis not present

## 2020-05-18 DIAGNOSIS — M545 Low back pain, unspecified: Secondary | ICD-10-CM | POA: Diagnosis not present

## 2020-05-18 DIAGNOSIS — J4520 Mild intermittent asthma, uncomplicated: Secondary | ICD-10-CM | POA: Diagnosis not present

## 2020-05-18 DIAGNOSIS — N182 Chronic kidney disease, stage 2 (mild): Secondary | ICD-10-CM | POA: Diagnosis not present

## 2020-07-21 DEATH — deceased
